# Patient Record
Sex: Male | Born: 1980 | Race: Black or African American | Hispanic: No | Marital: Single | Smoking: Never smoker
Health system: Southern US, Community
[De-identification: ages and names within clinical notes are randomized; demographics above are authoritative.]

## PROBLEM LIST (undated history)

## (undated) DIAGNOSIS — B20 Human immunodeficiency virus [HIV] disease: Secondary | ICD-10-CM

## (undated) DIAGNOSIS — Z21 Asymptomatic human immunodeficiency virus [HIV] infection status: Secondary | ICD-10-CM

## (undated) HISTORY — DX: Human immunodeficiency virus (HIV) disease: B20

## (undated) HISTORY — DX: Asymptomatic human immunodeficiency virus (hiv) infection status: Z21

---

## 2003-08-06 ENCOUNTER — Emergency Department (HOSPITAL_COMMUNITY): Admission: EM | Admit: 2003-08-06 | Discharge: 2003-08-06 | Payer: Self-pay | Admitting: Emergency Medicine

## 2005-02-09 ENCOUNTER — Emergency Department (HOSPITAL_COMMUNITY): Admission: EM | Admit: 2005-02-09 | Discharge: 2005-02-09 | Payer: Self-pay | Admitting: Emergency Medicine

## 2009-02-26 ENCOUNTER — Emergency Department (HOSPITAL_COMMUNITY): Admission: EM | Admit: 2009-02-26 | Discharge: 2009-02-26 | Payer: Self-pay | Admitting: Emergency Medicine

## 2009-12-02 ENCOUNTER — Emergency Department (HOSPITAL_COMMUNITY): Admission: EM | Admit: 2009-12-02 | Discharge: 2009-12-02 | Payer: Self-pay | Admitting: Emergency Medicine

## 2010-02-08 IMAGING — CR DG CHEST 2V
2 series · 2 of 2 positions shown · non-contrast
Comparison: None

CLINICAL DATA: Fever.

CHEST - 2 VIEW

[w chest pa]
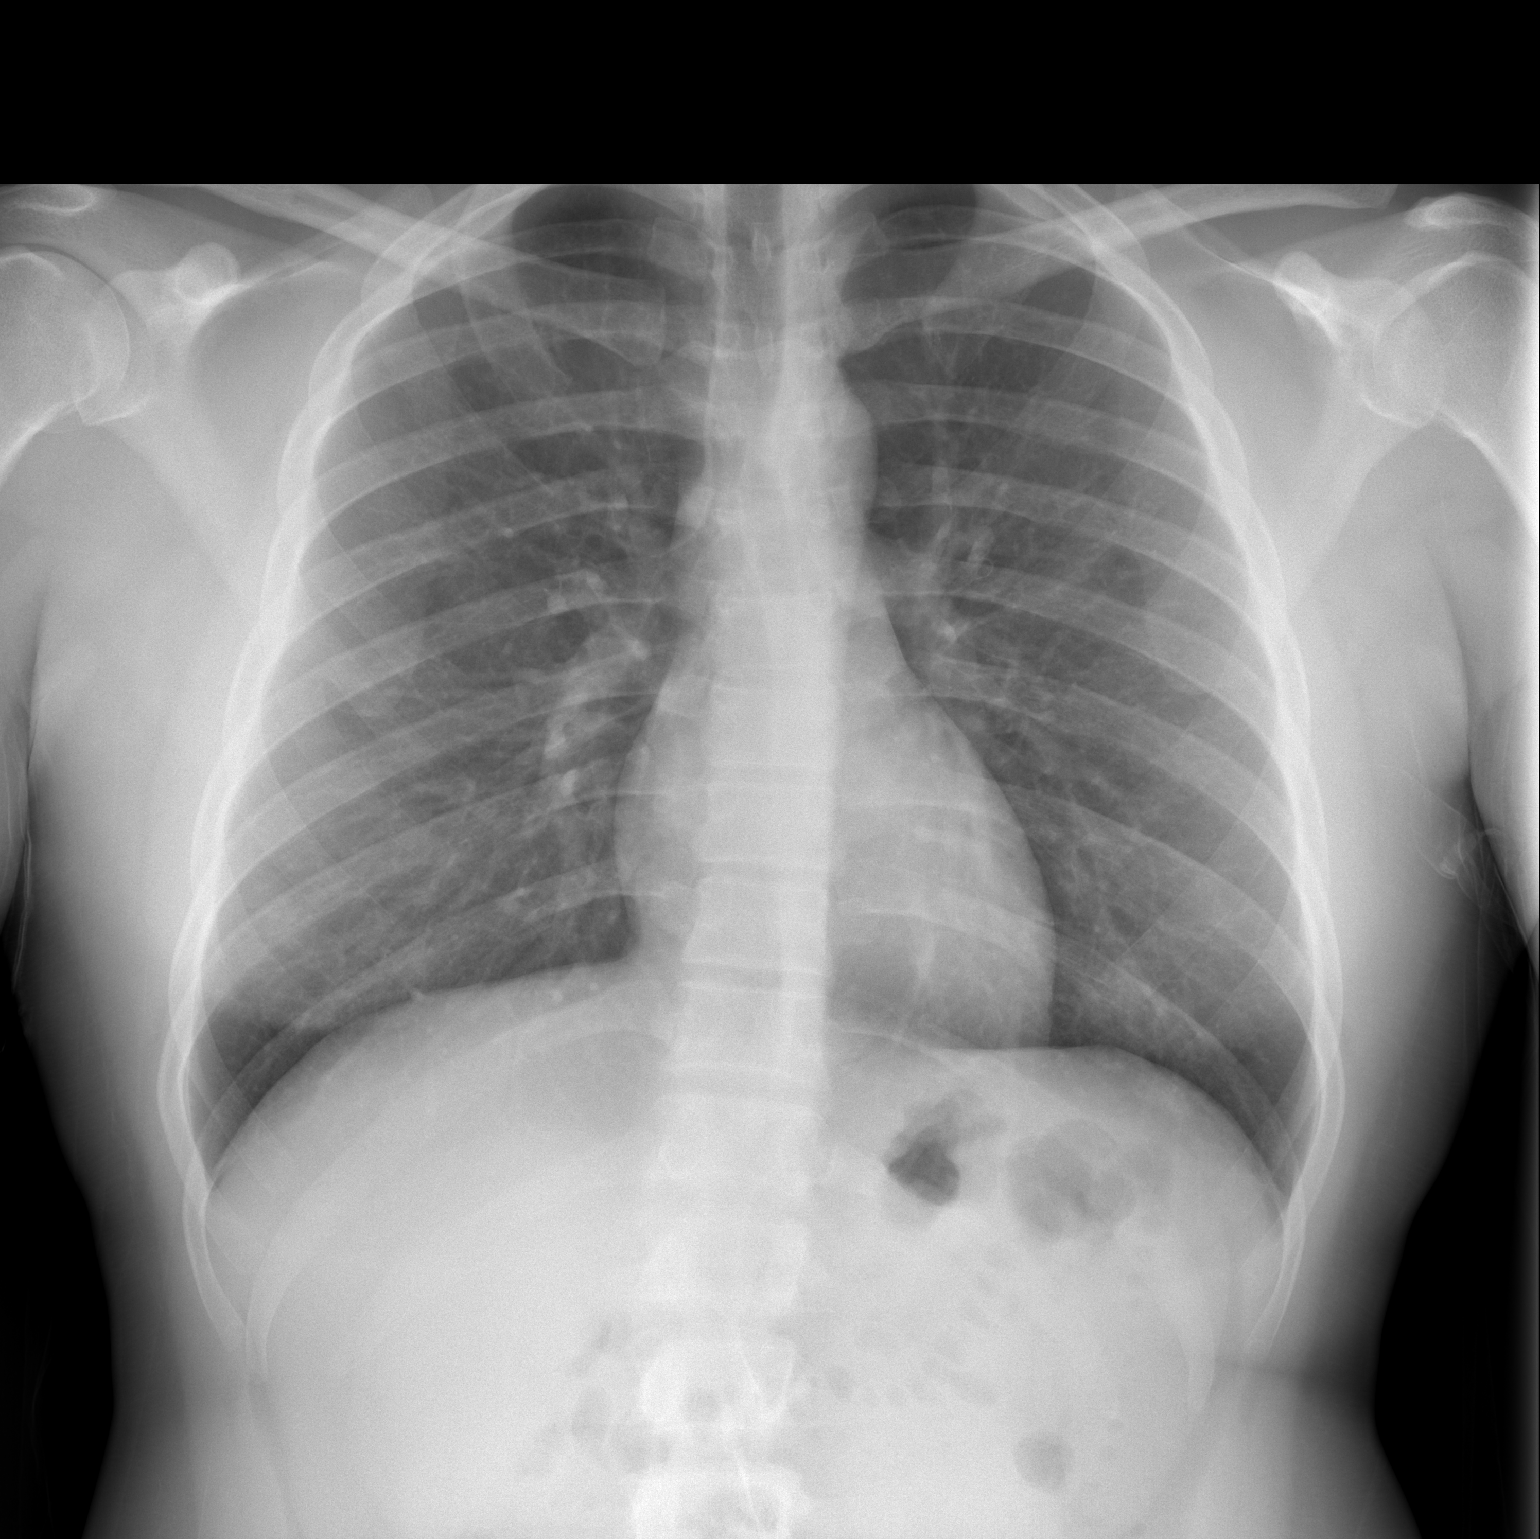

[w chest lat]
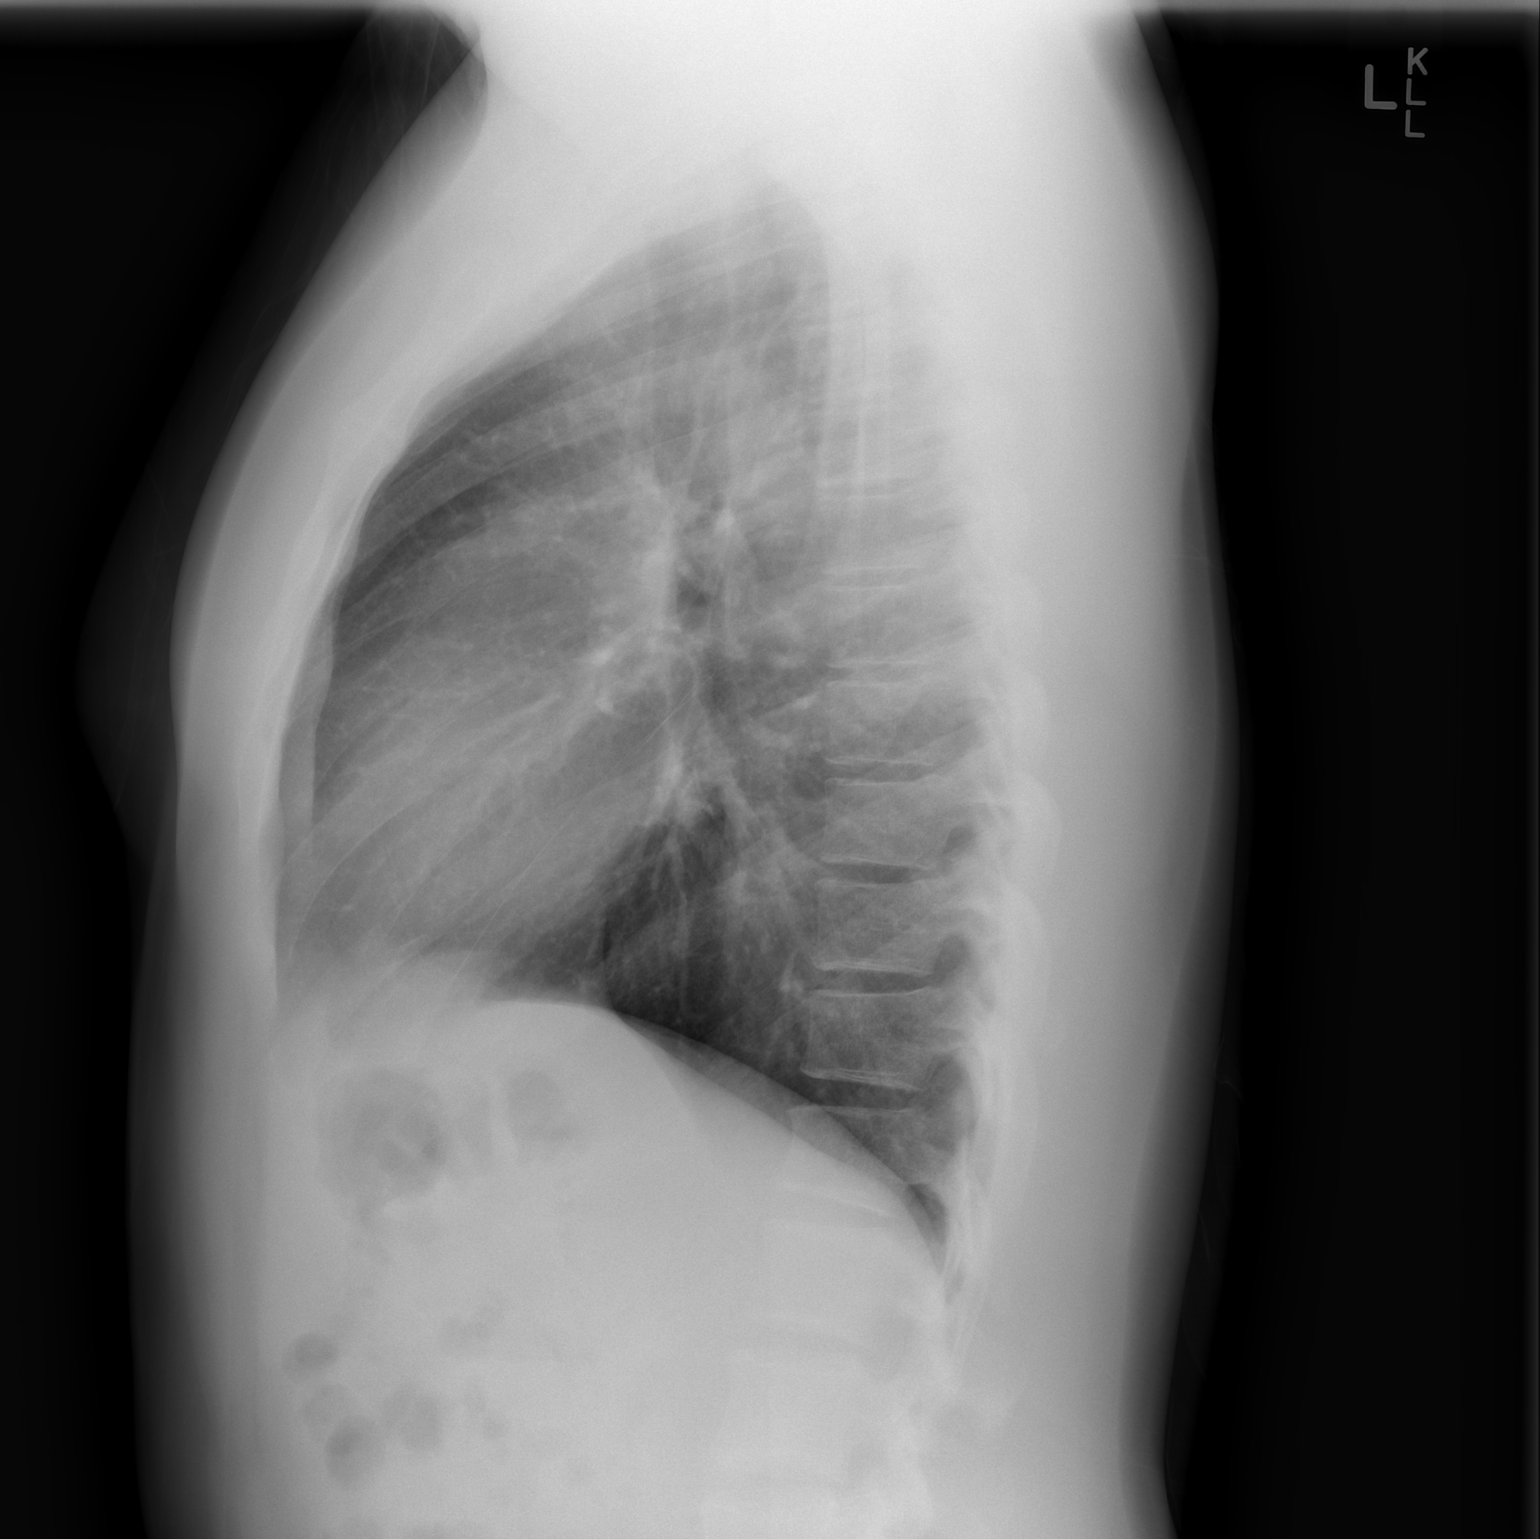

[2 of 2 positions shown; findings below may reference images not displayed]

FINDINGS: The cardiac silhouette, mediastinal and hilar contours
are within normal limits.  Mild bronchitic changes, likely related
to smoking.  No infiltrates, edema or effusions.  The bony thorax
intact.
IMPRESSION: Mild bronchitic changes, likely related to smoking.  No
infiltrates, edema or effusions.

## 2011-04-02 ENCOUNTER — Ambulatory Visit (INDEPENDENT_AMBULATORY_CARE_PROVIDER_SITE_OTHER): Payer: Self-pay

## 2011-04-02 DIAGNOSIS — Z113 Encounter for screening for infections with a predominantly sexual mode of transmission: Secondary | ICD-10-CM

## 2011-04-02 DIAGNOSIS — Z79899 Other long term (current) drug therapy: Secondary | ICD-10-CM

## 2011-04-02 DIAGNOSIS — B20 Human immunodeficiency virus [HIV] disease: Secondary | ICD-10-CM

## 2011-04-02 LAB — CBC WITH DIFFERENTIAL/PLATELET
Hemoglobin: 13.6 g/dL (ref 13.0–17.0)
Lymphocytes Relative: 36 % (ref 12–46)
Lymphs Abs: 1.7 10*3/uL (ref 0.7–4.0)
Monocytes Relative: 13 % — ABNORMAL HIGH (ref 3–12)
Neutro Abs: 2.1 10*3/uL (ref 1.7–7.7)
Neutrophils Relative %: 44 % (ref 43–77)
RBC: 4.75 MIL/uL (ref 4.22–5.81)
WBC: 4.8 10*3/uL (ref 4.0–10.5)

## 2011-04-02 LAB — LIPID PANEL
Cholesterol: 172 mg/dL (ref 0–200)
HDL: 34 mg/dL — ABNORMAL LOW (ref >39)
LDL Cholesterol: 113 mg/dL — ABNORMAL HIGH (ref 0–99)
Total CHOL/HDL Ratio: 5.1 Ratio
Triglycerides: 124 mg/dL (ref <150)
VLDL: 25 mg/dL (ref 0–40)

## 2011-04-03 LAB — COMPLETE METABOLIC PANEL WITH GFR
ALT: 27 U/L (ref 0–53)
Albumin: 3.9 g/dL (ref 3.5–5.2)
CO2: 27 mEq/L (ref 19–32)
GFR, Est African American: >60 mL/min (ref >60)
GFR, Est Non African American: >60 mL/min (ref >60)
Glucose, Bld: 86 mg/dL (ref 70–99)
Potassium: 3.9 mEq/L (ref 3.5–5.3)
Sodium: 140 mEq/L (ref 135–145)
Total Protein: 8.4 g/dL — ABNORMAL HIGH (ref 6.0–8.3)

## 2011-04-03 LAB — URINALYSIS, ROUTINE W REFLEX MICROSCOPIC
Bilirubin Urine: NEGATIVE
Glucose, UA: NEGATIVE mg/dL
Leukocytes, UA: NEGATIVE
Protein, ur: NEGATIVE mg/dL
pH: 6.5 (ref 5.0–8.0)

## 2011-04-03 LAB — HIV-1 RNA ULTRAQUANT REFLEX TO GENTYP+
HIV 1 RNA Quant: 46400 copies/mL — ABNORMAL HIGH (ref <20)
HIV-1 RNA Quant, Log: 4.67 {Log} — ABNORMAL HIGH (ref <1.30)

## 2011-04-03 LAB — HEPATITIS B SURFACE ANTIBODY,QUALITATIVE: Hep B S Ab: POSITIVE — AB

## 2011-04-03 LAB — T-HELPER CELL (CD4) - (RCID CLINIC ONLY)
CD4 % Helper T Cell: 17 % — ABNORMAL LOW (ref 33–55)
CD4 T Cell Abs: 280 uL — ABNORMAL LOW (ref 400–2700)

## 2011-04-03 LAB — RPR

## 2011-04-04 LAB — TB SKIN TEST: Induration: 0

## 2011-04-05 NOTE — Progress Notes (Signed)
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 He is a part of the [employer] and anticipates starting a new job soon. [place] [employer] and received several vaccines from the health dept.  He is a well mannered young man who works out 5 times weekly with weights and cardio. He has not presented for treatment since his diagnosis in 2010 "due to a serious of unfortunate events in his life/family". He is now ready to deal with his diagnosis and has been without any symptoms

## 2011-04-11 LAB — HIV-1 GENOTYPR PLUS

## 2011-04-12 ENCOUNTER — Telehealth: Payer: Self-pay | Admitting: *Deleted

## 2011-04-12 NOTE — Telephone Encounter (Signed)
PPD negative

## 2011-04-19 ENCOUNTER — Encounter: Payer: Self-pay | Admitting: Internal Medicine

## 2011-04-19 ENCOUNTER — Ambulatory Visit: Payer: Self-pay

## 2011-04-19 ENCOUNTER — Other Ambulatory Visit: Payer: Self-pay | Admitting: *Deleted

## 2011-04-19 ENCOUNTER — Ambulatory Visit (INDEPENDENT_AMBULATORY_CARE_PROVIDER_SITE_OTHER): Payer: Self-pay | Admitting: Internal Medicine

## 2011-04-19 VITALS — BP 119/74 | HR 88 | Temp 98.8°F | Ht 66.5 in | Wt 200.0 lb

## 2011-04-19 DIAGNOSIS — B20 Human immunodeficiency virus [HIV] disease: Secondary | ICD-10-CM

## 2011-04-19 DIAGNOSIS — Z23 Encounter for immunization: Secondary | ICD-10-CM

## 2011-04-19 MED ORDER — EFAVIRENZ-EMTRICITAB-TENOFOVIR 600-200-300 MG PO TABS: 1.0000 | ORAL_TABLET | Freq: Every day | ORAL | Status: DC

## 2011-06-07 ENCOUNTER — Other Ambulatory Visit: Payer: Self-pay | Admitting: Internal Medicine

## 2011-06-07 ENCOUNTER — Other Ambulatory Visit (INDEPENDENT_AMBULATORY_CARE_PROVIDER_SITE_OTHER): Payer: Self-pay

## 2011-06-07 DIAGNOSIS — B20 Human immunodeficiency virus [HIV] disease: Secondary | ICD-10-CM

## 2011-06-07 LAB — COMPLETE METABOLIC PANEL WITH GFR
Albumin: 3.9 g/dL (ref 3.5–5.2)
BUN: 18 mg/dL (ref 6–23)
CO2: 21 mEq/L (ref 19–32)
Calcium: 9.4 mg/dL (ref 8.4–10.5)
Chloride: 108 mEq/L (ref 96–112)
GFR, Est African American: >60 mL/min (ref >60)
GFR, Est Non African American: >60 mL/min (ref >60)
Glucose, Bld: 113 mg/dL — ABNORMAL HIGH (ref 70–99)
Potassium: 4.4 mEq/L (ref 3.5–5.3)
Sodium: 138 mEq/L (ref 135–145)
Total Protein: 7.6 g/dL (ref 6.0–8.3)

## 2011-06-07 LAB — CBC WITH DIFFERENTIAL/PLATELET
Basophils Absolute: 0 10*3/uL (ref 0.0–0.1)
Basophils Relative: 1 % (ref 0–1)
Lymphocytes Relative: 37 % (ref 12–46)
MCHC: 33 g/dL (ref 30.0–36.0)
Monocytes Absolute: 0.4 10*3/uL (ref 0.1–1.0)
Neutro Abs: 2.3 10*3/uL (ref 1.7–7.7)
Platelets: 304 10*3/uL (ref 150–400)
RDW: 14.4 % (ref 11.5–15.5)
WBC: 4.6 10*3/uL (ref 4.0–10.5)

## 2011-06-07 LAB — T-HELPER CELL (CD4) - (RCID CLINIC ONLY): CD4 % Helper T Cell: 19 % — ABNORMAL LOW (ref 33–55)

## 2011-06-20 ENCOUNTER — Ambulatory Visit (INDEPENDENT_AMBULATORY_CARE_PROVIDER_SITE_OTHER): Payer: Self-pay | Admitting: Internal Medicine

## 2011-06-20 ENCOUNTER — Encounter: Payer: Self-pay | Admitting: Internal Medicine

## 2011-06-20 VITALS — BP 121/82 | HR 96 | Temp 98.8°F | Ht 66.0 in | Wt 199.0 lb

## 2011-06-20 DIAGNOSIS — B20 Human immunodeficiency virus [HIV] disease: Secondary | ICD-10-CM | POA: Insufficient documentation

## 2011-06-20 NOTE — Assessment & Plan Note (Signed)
He is doing well on his current regimen of Atripla. I did discuss with him condom use both protect others as well as himself from getting other resistant viruses. I also discussed healthy eating and his cholesterol, kidney, and cardiovascular long-term effects of the medicine as well as HIV. Discussed his diet the best is to have a low-fat healthy diet and to keep his weight down 4 optimal long-term benefit. He will return in 4 months and I did tell him to call sooner if he has any significant issues.

## 2011-06-20 NOTE — Progress Notes (Signed)
  Subjective:    Patient ID: Jesse Frank, male    DOB: 1980-09-03, 30 y.o.   MRN: [id number]  HPI Jesse Frank comes back in for followup after starting his regimen of Atripla. He states that he is tolerating it very well with no rashes and very minimal CNS side effects. He also reports nearly 100% compliance with only one dose that he took late. He otherwise continues to exercise well, he is improving his diet and is very happy with his current regimen. He denies any dysphasia, diarrhea, weight loss, abdominal pain, rash or urinary problems.    Review of Systems  Constitutional: Negative for fever, activity change, appetite change, fatigue and unexpected weight change.  Eyes: Negative for redness.  Respiratory: Negative for cough and shortness of breath.   Cardiovascular: Negative for leg swelling.  Gastrointestinal: Negative for nausea, abdominal pain, diarrhea, constipation and abdominal distention.  Musculoskeletal: Negative for myalgias and arthralgias.  Skin: Negative for rash.  Neurological: Negative for dizziness and headaches.  Hematological: Negative for adenopathy.  Psychiatric/Behavioral: Negative for confusion and dysphoric mood.       Objective:   Physical Exam  Constitutional: He is oriented to person, place, and time. He appears well-developed and well-nourished. No distress.  HENT:  Head: Normocephalic.  Mouth/Throat: Oropharynx is clear and moist. No oropharyngeal exudate.  Eyes: Right eye exhibits no discharge. Left eye exhibits no discharge. No scleral icterus.  Cardiovascular: Normal rate, regular rhythm and normal heart sounds.  Exam reveals no gallop.   No murmur heard. Pulmonary/Chest: Effort normal and breath sounds normal. No respiratory distress. He has no wheezes.  Abdominal: Soft. Bowel sounds are normal. He exhibits no distension and no mass. There is no tenderness.  Genitourinary: Penis normal. No penile tenderness.  Musculoskeletal: Normal range of  motion. He exhibits no edema.  Lymphadenopathy:    He has no cervical adenopathy.  Neurological: He is alert and oriented to person, place, and time. No cranial nerve deficit.  Skin: Skin is warm and dry. No rash noted. No erythema.  Psychiatric: He has a normal mood and affect. His behavior is normal.          Assessment & Plan:

## 2011-10-08 ENCOUNTER — Other Ambulatory Visit: Payer: Self-pay

## 2011-10-08 ENCOUNTER — Other Ambulatory Visit: Payer: Self-pay | Admitting: Licensed Clinical Social Worker

## 2011-10-08 DIAGNOSIS — B20 Human immunodeficiency virus [HIV] disease: Secondary | ICD-10-CM

## 2011-10-08 LAB — CBC WITH DIFFERENTIAL/PLATELET
Basophils Absolute: 0 10*3/uL (ref 0.0–0.1)
Lymphocytes Relative: 23 % (ref 12–46)
Lymphs Abs: 1.6 10*3/uL (ref 0.7–4.0)
Neutro Abs: 4.6 10*3/uL (ref 1.7–7.7)
Platelets: 245 10*3/uL (ref 150–400)
RBC: 5 MIL/uL (ref 4.22–5.81)
RDW: 13.4 % (ref 11.5–15.5)
WBC: 7 10*3/uL (ref 4.0–10.5)

## 2011-10-08 LAB — COMPLETE METABOLIC PANEL WITH GFR
AST: 17 U/L (ref 0–37)
Albumin: 4 g/dL (ref 3.5–5.2)
Alkaline Phosphatase: 57 U/L (ref 39–117)
BUN: 8 mg/dL (ref 6–23)
Calcium: 9.7 mg/dL (ref 8.4–10.5)
Chloride: 101 mEq/L (ref 96–112)
Creat: 1.2 mg/dL (ref 0.50–1.35)
GFR, Est Non African American: 81 mL/min
Glucose, Bld: 105 mg/dL — ABNORMAL HIGH (ref 70–99)
Potassium: 4.1 mEq/L (ref 3.5–5.3)

## 2011-10-08 MED ORDER — EFAVIRENZ-EMTRICITAB-TENOFOVIR 600-200-300 MG PO TABS: 1.0000 | ORAL_TABLET | Freq: Every day | ORAL | Status: DC

## 2011-10-09 LAB — T-HELPER CELL (CD4) - (RCID CLINIC ONLY)
CD4 % Helper T Cell: 26 % — ABNORMAL LOW (ref 33–55)
CD4 T Cell Abs: 420 uL (ref 400–2700)

## 2011-10-10 LAB — HIV-1 RNA QUANT-NO REFLEX-BLD: HIV 1 RNA Quant: <20 copies/mL (ref <20)

## 2011-10-21 ENCOUNTER — Other Ambulatory Visit: Payer: Self-pay | Admitting: *Deleted

## 2011-10-21 DIAGNOSIS — B20 Human immunodeficiency virus [HIV] disease: Secondary | ICD-10-CM

## 2011-10-21 MED ORDER — EFAVIRENZ-EMTRICITAB-TENOFOVIR 600-200-300 MG PO TABS: 1.0000 | ORAL_TABLET | Freq: Every day | ORAL | Status: DC

## 2011-10-22 ENCOUNTER — Ambulatory Visit (INDEPENDENT_AMBULATORY_CARE_PROVIDER_SITE_OTHER): Payer: Self-pay | Admitting: Internal Medicine

## 2011-10-22 ENCOUNTER — Encounter: Payer: Self-pay | Admitting: Internal Medicine

## 2011-10-22 VITALS — BP 115/78 | HR 111 | Temp 98.5°F | Wt 210.0 lb

## 2011-10-22 DIAGNOSIS — B20 Human immunodeficiency virus [HIV] disease: Secondary | ICD-10-CM

## 2011-10-22 NOTE — Assessment & Plan Note (Signed)
He is now undetectable and was very pleased with that.  He will return in 3 months and if it remains undetectable, can spread out his appointments.  I did remind him to use condoms with all sexual activity.

## 2011-10-22 NOTE — Progress Notes (Signed)
  Subjective:    Patient ID: Jesse Frank, male    DOB: 02-27-1981, 31 y.o.   MRN: [id number]  HPI he is here for routine follow up.  He was started on Atripla in 2012 and has been tolerating it well.  No new issues.  He endorses just a missed dose or two.  He has no complaints and no recent STIs or other issues.      Review of Systems  Constitutional: Negative.   HENT: Negative for sore throat and trouble swallowing.   Respiratory: Negative for cough, shortness of breath and wheezing.   Cardiovascular: Negative for chest pain, palpitations and leg swelling.  Gastrointestinal: Negative for nausea, vomiting, abdominal pain and diarrhea.  Genitourinary: Negative for discharge and genital sores.  Musculoskeletal: Negative for myalgias and arthralgias.  Skin: Negative for pallor and rash.  Neurological: Negative for syncope, weakness and headaches.  Hematological: Negative for adenopathy.  Psychiatric/Behavioral: Negative for sleep disturbance and dysphoric mood. The patient is not nervous/anxious.        Objective:   Physical Exam  Constitutional: He appears well-developed and well-nourished. No distress.  HENT:  Mouth/Throat: Oropharynx is clear and moist. No oropharyngeal exudate.  Cardiovascular: Normal rate, regular rhythm and normal heart sounds.  Exam reveals no gallop and no friction rub.   No murmur heard. Pulmonary/Chest: Effort normal and breath sounds normal. No respiratory distress. He has no wheezes. He has no rales.  Abdominal: Soft. Bowel sounds are normal. He exhibits no distension. There is no tenderness. There is no rebound.  Lymphadenopathy:    He has no cervical adenopathy.  Skin: Skin is warm and dry. No rash noted. No erythema.  Psychiatric: He has a normal mood and affect. His behavior is normal.          Assessment & Plan:

## 2012-01-07 ENCOUNTER — Telehealth: Payer: Self-pay

## 2012-01-07 NOTE — Telephone Encounter (Signed)
Patient had stated recently he had insurance - called and left message for him to call me about Jesse Frank if that's changed, or to make sure he gets his insurance info in the system when he comes in for appt. this week.

## 2012-01-09 ENCOUNTER — Other Ambulatory Visit: Payer: Self-pay

## 2012-01-09 DIAGNOSIS — B20 Human immunodeficiency virus [HIV] disease: Secondary | ICD-10-CM

## 2012-01-09 LAB — COMPREHENSIVE METABOLIC PANEL
ALT: 51 U/L (ref 0–53)
Albumin: 4 g/dL (ref 3.5–5.2)
Alkaline Phosphatase: 60 U/L (ref 39–117)
Glucose, Bld: 102 mg/dL — ABNORMAL HIGH (ref 70–99)
Potassium: 4.4 mEq/L (ref 3.5–5.3)
Sodium: 138 mEq/L (ref 135–145)
Total Bilirubin: 0.3 mg/dL (ref 0.3–1.2)
Total Protein: 7.3 g/dL (ref 6.0–8.3)

## 2012-01-09 LAB — CBC WITH DIFFERENTIAL/PLATELET
Basophils Relative: 1 % (ref 0–1)
Eosinophils Absolute: 0.1 10*3/uL (ref 0.0–0.7)
Hemoglobin: 15.2 g/dL (ref 13.0–17.0)
Lymphs Abs: 1.6 10*3/uL (ref 0.7–4.0)
MCH: 30.3 pg (ref 26.0–34.0)
MCHC: 34.1 g/dL (ref 30.0–36.0)
Monocytes Relative: 8 % (ref 3–12)
Neutro Abs: 2.2 10*3/uL (ref 1.7–7.7)
Neutrophils Relative %: 52 % (ref 43–77)
Platelets: 255 10*3/uL (ref 150–400)
RBC: 5.01 MIL/uL (ref 4.22–5.81)

## 2012-01-10 LAB — HIV-1 RNA QUANT-NO REFLEX-BLD
HIV 1 RNA Quant: <20 copies/mL (ref <20)
HIV-1 RNA Quant, Log: <1.3 {Log} (ref <1.30)

## 2012-01-23 ENCOUNTER — Ambulatory Visit (INDEPENDENT_AMBULATORY_CARE_PROVIDER_SITE_OTHER): Payer: Private Health Insurance - Indemnity | Admitting: Internal Medicine

## 2012-01-23 ENCOUNTER — Encounter: Payer: Self-pay | Admitting: Internal Medicine

## 2012-01-23 ENCOUNTER — Other Ambulatory Visit (INDEPENDENT_AMBULATORY_CARE_PROVIDER_SITE_OTHER): Payer: Private Health Insurance - Indemnity | Admitting: *Deleted

## 2012-01-23 VITALS — BP 118/77 | HR 78 | Temp 98.5°F | Ht 66.0 in | Wt 217.8 lb

## 2012-01-23 DIAGNOSIS — Z113 Encounter for screening for infections with a predominantly sexual mode of transmission: Secondary | ICD-10-CM

## 2012-01-23 DIAGNOSIS — B20 Human immunodeficiency virus [HIV] disease: Secondary | ICD-10-CM

## 2012-01-23 DIAGNOSIS — Z0000 Encounter for general adult medical examination without abnormal findings: Secondary | ICD-10-CM

## 2012-01-23 DIAGNOSIS — Z79899 Other long term (current) drug therapy: Secondary | ICD-10-CM

## 2012-01-23 DIAGNOSIS — Z23 Encounter for immunization: Secondary | ICD-10-CM

## 2012-01-23 MED ORDER — EFAVIRENZ-EMTRICITAB-TENOFOVIR 600-200-300 MG PO TABS: 1.0000 | ORAL_TABLET | Freq: Every day | ORAL | Status: DC

## 2012-01-23 NOTE — Progress Notes (Signed)
  Subjective:    Patient ID: Jesse Frank, male    DOB: 1981-07-08, 31 y.o.   MRN: [id number]  HPI He comes in for routine followup. Previously started on Atripla and continues to take it very well. He reports continued 100% compliance. No issues with side effects. No weight loss, no diarrhea no other issues. Feels well with his regimen and is very pleased with the lack of side effects. He is exercising some but does admit he needs to increase that. He does not smoke and he otherwise has no new issues including no recent hospitalizations.   Review of Systems  Constitutional: Negative.   HENT: Negative for sore throat and trouble swallowing.   Respiratory: Negative for cough, shortness of breath and wheezing.   Cardiovascular: Negative for chest pain, palpitations and leg swelling.  Gastrointestinal: Negative for nausea, vomiting, diarrhea, constipation and abdominal distention.  Musculoskeletal: Negative for myalgias, joint swelling and arthralgias.  Skin: Negative for pallor and rash.  Neurological: Negative for dizziness, weakness and headaches.  Hematological: Negative for adenopathy.  Psychiatric/Behavioral: Negative for dysphoric mood. The patient is not nervous/anxious.        Objective:   Physical Exam  Constitutional: He appears well-developed and well-nourished. No distress.  HENT:  Mouth/Throat: Oropharynx is clear and moist. No oropharyngeal exudate.  Cardiovascular: Normal rate, regular rhythm and normal heart sounds.  Exam reveals no gallop and no friction rub.   No murmur heard. Pulmonary/Chest: Effort normal and breath sounds normal. No respiratory distress. He has no wheezes. He has no rales.  Abdominal: Soft. Bowel sounds are normal. He exhibits no distension. There is no tenderness. There is no rebound.  Lymphadenopathy:    He has no cervical adenopathy.  Skin: Skin is warm and dry. No rash noted. No erythema.  Psychiatric: He has a normal mood and affect. His  behavior is normal.          Assessment & Plan:

## 2012-01-23 NOTE — Assessment & Plan Note (Addendum)
He is doing very well his regimen and now has remained undetectable for 2 visits. He will continue with his medications and will return in about 4-5 months for followup and a flu shot. He otherwise is up-to-date with his vaccines. He knows to call if he has any issues in the meantime. I did discuss increasing his weight loss and increasing his exercise to improve his long-term prognosis. Patient will attempt to do more exercise.

## 2012-02-17 ENCOUNTER — Other Ambulatory Visit: Payer: Self-pay | Admitting: *Deleted

## 2012-02-17 DIAGNOSIS — B20 Human immunodeficiency virus [HIV] disease: Secondary | ICD-10-CM

## 2012-02-17 MED ORDER — EFAVIRENZ-EMTRICITAB-TENOFOVIR 600-200-300 MG PO TABS: 1.0000 | ORAL_TABLET | Freq: Every day | ORAL | Status: DC

## 2012-06-17 ENCOUNTER — Telehealth: Payer: Self-pay | Admitting: *Deleted

## 2012-06-17 NOTE — Telephone Encounter (Signed)
Patient called and advised he thinks that he may need to be switched to a medication other than Atripla. He advised that he is having trouble concentrating at work. He advised he works on [employer] and has been having trouble focusing for long periods of time. He also is having trouble sleeping. He wants Dr Luciana Axe to write a letter to his employer in the mean time to help explain that this is a possible side effect until he has his appt 07/10/12. I tried to get the patient

## 2012-06-24 NOTE — Telephone Encounter (Signed)
Ok to write the letter.  See if he can come in tomorrow, ok to overbook and I will look at changing him.

## 2012-06-24 NOTE — Telephone Encounter (Signed)
Patient is set to come in tomorrow 06/25/12 at 1115 slot.

## 2012-06-25 ENCOUNTER — Other Ambulatory Visit: Payer: Private Health Insurance - Indemnity

## 2012-06-25 ENCOUNTER — Encounter: Payer: Self-pay | Admitting: Internal Medicine

## 2012-06-25 ENCOUNTER — Ambulatory Visit (INDEPENDENT_AMBULATORY_CARE_PROVIDER_SITE_OTHER): Payer: Private Health Insurance - Indemnity | Admitting: Internal Medicine

## 2012-06-25 VITALS — BP 122/79 | HR 80 | Temp 98.7°F | Ht 66.0 in | Wt 218.0 lb

## 2012-06-25 DIAGNOSIS — B20 Human immunodeficiency virus [HIV] disease: Secondary | ICD-10-CM

## 2012-06-25 DIAGNOSIS — Z23 Encounter for immunization: Secondary | ICD-10-CM

## 2012-06-25 MED ORDER — ELVITEG-COBIC-EMTRICIT-TENOFDF 150-150-200-300 MG PO TABS: 1.0000 | ORAL_TABLET | Freq: Every day | ORAL | Status: DC

## 2012-06-25 NOTE — Assessment & Plan Note (Signed)
With the concern of his symptoms, I will change him to Stribild today. I also told told him that many of his symptoms may be more related to anxiety and he will seek mental health counseling. He will return in one month for labs and I will see him after that for followup on the new medication.

## 2012-06-25 NOTE — Progress Notes (Signed)
  Subjective:    Patient ID: Jesse Frank, male    DOB: October 24, 1980, 31 y.o.   MRN: [id number]  HPI He comes in for a work in visit with complaint of difficulty concentrating at work as well as increased stress. He tells me this has been going on for about 2 months.  He has paperwork that will help give him leave from his work while he works on the stress. He also is concerned with the [employer].   Review of Systems  Constitutional: Positive for activity change and unexpected weight change. Negative for fever, appetite change and fatigue.       He has gained some weight from not exercising and not feeling well  Respiratory: Negative for cough and shortness of breath.   Gastrointestinal: Negative for nausea, abdominal pain and diarrhea.  Skin: Negative for rash.  Neurological: Positive for dizziness and light-headedness. Negative for headaches.  Psychiatric/Behavioral: Positive for disturbed wake/sleep cycle.       Objective:   Physical Exam  Constitutional: He appears well-developed and well-nourished. No distress.  Cardiovascular: Normal rate, regular rhythm and normal heart sounds.  Exam reveals no gallop and no friction rub.   No murmur heard. Pulmonary/Chest: Effort normal and breath sounds normal. No respiratory distress. He has no wheezes. He has no rales.  Abdominal: Soft. Bowel sounds are normal. He exhibits no distension. There is no tenderness. There is no rebound.          Assessment & Plan:

## 2012-07-01 ENCOUNTER — Telehealth: Payer: Self-pay

## 2012-07-01 NOTE — Telephone Encounter (Signed)
Case Manager is calling re: form faxed for disability. Pt requested a behavioral health disability claim.  She realizes Dr Luciana Axe is an Infectious Disease physician and says if he is not able to complete the form based on the mental health of the patient a different form (medical) can be sent for completion.   Form must be completed prior to July 09, 2012 whether it is medical or mental health.  She can be reached for more information at [phone]. Form was located in Dr [employer] box in the triage area.   Laurell Josephs, RN

## 2012-07-06 ENCOUNTER — Telehealth: Payer: Self-pay | Admitting: *Deleted

## 2012-07-06 NOTE — Telephone Encounter (Signed)
Patient's insurance is denying his claim under Dr. Ephriam Knuckles recommendation to be out of work x 1 month due to medication side effects.  They are stating that he needs to be under a psychiatrist's care.  Left message for him to return my call at which time he can be given the number for [facility] if he wants to arrange to see someone there or to be seen by a private provider. Wendall Mola CMA

## 2012-07-07 ENCOUNTER — Telehealth: Payer: Self-pay | Admitting: *Deleted

## 2012-07-07 NOTE — Telephone Encounter (Signed)
Patient returned the call he received from [place]. Advised him of the note and patient requested [facility] [employer] number and gave him [phone] advised him if they need anything have them call the office and we will do what we can. Gertie Gowda he returned the call.

## 2012-07-09 ENCOUNTER — Ambulatory Visit: Payer: Private Health Insurance - Indemnity | Admitting: Internal Medicine

## 2012-07-09 ENCOUNTER — Other Ambulatory Visit: Payer: Private Health Insurance - Indemnity

## 2012-07-09 DIAGNOSIS — B20 Human immunodeficiency virus [HIV] disease: Secondary | ICD-10-CM

## 2012-07-09 DIAGNOSIS — Z113 Encounter for screening for infections with a predominantly sexual mode of transmission: Secondary | ICD-10-CM

## 2012-07-09 LAB — COMPREHENSIVE METABOLIC PANEL
ALT: 53 U/L (ref 0–53)
AST: 21 U/L (ref 0–37)
Albumin: 4.3 g/dL (ref 3.5–5.2)
Alkaline Phosphatase: 67 U/L (ref 39–117)
BUN: 10 mg/dL (ref 6–23)
Creat: 1.02 mg/dL (ref 0.50–1.35)
Potassium: 4.5 mEq/L (ref 3.5–5.3)

## 2012-07-09 LAB — CBC WITH DIFFERENTIAL/PLATELET
Basophils Absolute: 0.1 10*3/uL (ref 0.0–0.1)
Basophils Relative: 1 % (ref 0–1)
HCT: 46 % (ref 39.0–52.0)
MCHC: 35.2 g/dL (ref 30.0–36.0)
Monocytes Absolute: 0.5 10*3/uL (ref 0.1–1.0)
Neutro Abs: 3.7 10*3/uL (ref 1.7–7.7)
Platelets: 279 10*3/uL (ref 150–400)
RDW: 14.4 % (ref 11.5–15.5)
WBC: 6.4 10*3/uL (ref 4.0–10.5)

## 2012-07-10 LAB — HIV-1 RNA QUANT-NO REFLEX-BLD
HIV 1 RNA Quant: <20 copies/mL (ref <20)
HIV-1 RNA Quant, Log: <1.3 {Log} (ref <1.30)

## 2012-07-14 ENCOUNTER — Telehealth: Payer: Self-pay | Admitting: *Deleted

## 2012-07-14 NOTE — Telephone Encounter (Signed)
Called patient and notified he was positive for chlamydia, and per Dr. Luciana Axe he needs to be treated with Rocephin 250 mg IM and Azithromycin 1,000 mg by mouth.  He was put on the nurse schedule for 07/17/12. Wendall Mola CMA

## 2012-07-16 ENCOUNTER — Ambulatory Visit (INDEPENDENT_AMBULATORY_CARE_PROVIDER_SITE_OTHER): Payer: Private Health Insurance - Indemnity | Admitting: *Deleted

## 2012-07-16 DIAGNOSIS — A749 Chlamydial infection, unspecified: Secondary | ICD-10-CM

## 2012-07-16 MED ORDER — LIDOCAINE HCL 1 % IJ SOLN
250.0000 mg | Freq: Once | INTRAMUSCULAR | Status: AC
  Administered 2012-07-16: 250 mg via INTRAMUSCULAR

## 2012-07-16 MED ORDER — AZITHROMYCIN 250 MG PO TABS
1000.0000 mg | ORAL_TABLET | Freq: Once | ORAL | Status: AC
  Administered 2012-07-16: 1000 mg via ORAL

## 2012-08-06 ENCOUNTER — Telehealth: Payer: Self-pay | Admitting: *Deleted

## 2012-08-06 ENCOUNTER — Ambulatory Visit: Payer: Private Health Insurance - Indemnity | Admitting: Internal Medicine

## 2012-08-06 NOTE — Telephone Encounter (Signed)
Called and left patient a voice mail to call the clinic to reschedule his appt, he no showed today. Jacqueline Cockerham  

## 2012-08-06 NOTE — Telephone Encounter (Signed)
Per Dr. Luciana Axe patient's lab look good and he can call for the results and just get a follow appt for labs and MD in 3 months. Called and left voice mail stating this. Wendall Mola

## 2012-12-17 ENCOUNTER — Other Ambulatory Visit: Payer: Self-pay | Admitting: Internal Medicine

## 2013-01-07 ENCOUNTER — Other Ambulatory Visit: Payer: Self-pay | Admitting: Internal Medicine

## 2013-01-07 ENCOUNTER — Other Ambulatory Visit: Payer: Private Health Insurance - Indemnity

## 2013-01-07 DIAGNOSIS — B20 Human immunodeficiency virus [HIV] disease: Secondary | ICD-10-CM

## 2013-01-07 DIAGNOSIS — Z113 Encounter for screening for infections with a predominantly sexual mode of transmission: Secondary | ICD-10-CM

## 2013-01-07 DIAGNOSIS — Z79899 Other long term (current) drug therapy: Secondary | ICD-10-CM

## 2013-01-07 LAB — CBC WITH DIFFERENTIAL/PLATELET
Basophils Relative: 1 % (ref 0–1)
Eosinophils Relative: 2 % (ref 0–5)
HCT: 44.1 % (ref 39.0–52.0)
Hemoglobin: 15.2 g/dL (ref 13.0–17.0)
MCH: 29.7 pg (ref 26.0–34.0)
MCHC: 34.5 g/dL (ref 30.0–36.0)
MCV: 86.1 fL (ref 78.0–100.0)
Monocytes Absolute: 0.5 10*3/uL (ref 0.1–1.0)
Monocytes Relative: 8 % (ref 3–12)
Neutro Abs: 3.2 10*3/uL (ref 1.7–7.7)
RDW: 14.4 % (ref 11.5–15.5)

## 2013-01-08 LAB — COMPREHENSIVE METABOLIC PANEL
Albumin: 4.2 g/dL (ref 3.5–5.2)
Alkaline Phosphatase: 50 U/L (ref 39–117)
BUN: 9 mg/dL (ref 6–23)
Calcium: 9.8 mg/dL (ref 8.4–10.5)
Creat: 1.22 mg/dL (ref 0.50–1.35)
Glucose, Bld: 98 mg/dL (ref 70–99)
Potassium: 4.4 mEq/L (ref 3.5–5.3)

## 2013-01-08 LAB — LIPID PANEL
Cholesterol: 189 mg/dL (ref 0–200)
Total CHOL/HDL Ratio: 5.7 Ratio
VLDL: 28 mg/dL (ref 0–40)

## 2013-01-08 LAB — T-HELPER CELL (CD4) - (RCID CLINIC ONLY)
CD4 % Helper T Cell: 27 % — ABNORMAL LOW (ref 33–55)
CD4 T Cell Abs: 550 uL (ref 400–2700)

## 2013-01-08 LAB — RPR

## 2013-01-10 LAB — HIV-1 RNA QUANT-NO REFLEX-BLD: HIV 1 RNA Quant: <20 copies/mL (ref <20)

## 2013-01-20 ENCOUNTER — Other Ambulatory Visit: Payer: Self-pay | Admitting: Internal Medicine

## 2013-01-21 ENCOUNTER — Ambulatory Visit (INDEPENDENT_AMBULATORY_CARE_PROVIDER_SITE_OTHER): Payer: Private Health Insurance - Indemnity | Admitting: Internal Medicine

## 2013-01-21 ENCOUNTER — Encounter: Payer: Self-pay | Admitting: Internal Medicine

## 2013-01-21 VITALS — BP 132/80 | HR 98 | Temp 98.5°F | Ht 67.0 in | Wt 232.0 lb

## 2013-01-21 DIAGNOSIS — B20 Human immunodeficiency virus [HIV] disease: Secondary | ICD-10-CM

## 2013-01-21 DIAGNOSIS — Z79899 Other long term (current) drug therapy: Secondary | ICD-10-CM

## 2013-01-21 DIAGNOSIS — Z23 Encounter for immunization: Secondary | ICD-10-CM

## 2013-01-21 DIAGNOSIS — E785 Hyperlipidemia, unspecified: Secondary | ICD-10-CM

## 2013-01-21 DIAGNOSIS — Z113 Encounter for screening for infections with a predominantly sexual mode of transmission: Secondary | ICD-10-CM

## 2013-01-21 NOTE — Progress Notes (Signed)
  Subjective:    Patient ID: Jesse Frank, male    DOB: 1980-12-16, 32 y.o.   MRN: [id number]  HPI He comes in for followup of HIV. He previously was on Atripla however due to some symptoms of anxiety and decreased concentration at work I changed him to [employer]. He started that 6 months ago after her last saw him and he has noted a significant improvement in all of his symptoms. Today he is much happier and pleased with those results. He is happy with his current medication and will continue with this. He denies any missed doses and his CD4 count and viral load are good with a CD4 of 550 and an undetectable viral load. His cholesterol was checked however he was not fasting for that lab.   Review of Systems  Constitutional: Negative for fever, appetite change, fatigue and unexpected weight change.  HENT: Negative for sore throat and trouble swallowing.   Respiratory: Negative for cough and shortness of breath.   Cardiovascular: Negative for chest pain and leg swelling.  Gastrointestinal: Negative for nausea, abdominal pain and diarrhea.  Musculoskeletal: Negative for myalgias, joint swelling and arthralgias.  Skin: Negative for rash.  Neurological: Negative for dizziness, light-headedness and headaches.       Objective:   Physical Exam  Constitutional: He appears well-developed and well-nourished. No distress.  HENT:  Mouth/Throat: Oropharynx is clear and moist. No oropharyngeal exudate.  Cardiovascular: Normal rate, regular rhythm and normal heart sounds.  Exam reveals no gallop and no friction rub.   No murmur heard. Pulmonary/Chest: Effort normal and breath sounds normal. No respiratory distress. He has no wheezes. He has no rales.  Lymphadenopathy:    He has no cervical adenopathy.  Skin: No rash noted.          Assessment & Plan:

## 2013-01-21 NOTE — Patient Instructions (Signed)
And is in the past of some

## 2013-01-21 NOTE — Assessment & Plan Note (Signed)
His cholesterol is mildly elevated I will check it next visit fasting.

## 2013-01-21 NOTE — Assessment & Plan Note (Signed)
Doing well on his new regimen. He will return in 6 months

## 2013-03-06 ENCOUNTER — Other Ambulatory Visit: Payer: Self-pay | Admitting: Internal Medicine

## 2013-04-05 ENCOUNTER — Other Ambulatory Visit: Payer: Self-pay | Admitting: Internal Medicine

## 2013-04-05 DIAGNOSIS — B20 Human immunodeficiency virus [HIV] disease: Secondary | ICD-10-CM

## 2013-07-08 ENCOUNTER — Other Ambulatory Visit: Payer: Self-pay

## 2013-07-13 ENCOUNTER — Other Ambulatory Visit: Payer: Private Health Insurance - Indemnity

## 2013-07-13 DIAGNOSIS — Z79899 Other long term (current) drug therapy: Secondary | ICD-10-CM

## 2013-07-13 DIAGNOSIS — B20 Human immunodeficiency virus [HIV] disease: Secondary | ICD-10-CM

## 2013-07-13 DIAGNOSIS — Z113 Encounter for screening for infections with a predominantly sexual mode of transmission: Secondary | ICD-10-CM

## 2013-07-13 LAB — CBC WITH DIFFERENTIAL/PLATELET
Basophils Absolute: 0 10*3/uL (ref 0.0–0.1)
Eosinophils Absolute: 0.1 10*3/uL (ref 0.0–0.7)
Eosinophils Relative: 2 % (ref 0–5)
MCH: 29.9 pg (ref 26.0–34.0)
MCV: 87.1 fL (ref 78.0–100.0)
Platelets: 245 10*3/uL (ref 150–400)
RDW: 13.8 % (ref 11.5–15.5)
WBC: 5.6 10*3/uL (ref 4.0–10.5)

## 2013-07-13 LAB — RPR

## 2013-07-13 LAB — LIPID PANEL
Cholesterol: 209 mg/dL — ABNORMAL HIGH (ref 0–200)
HDL: 38 mg/dL — ABNORMAL LOW (ref >39)
Total CHOL/HDL Ratio: 5.5 Ratio
VLDL: 24 mg/dL (ref 0–40)

## 2013-07-13 LAB — COMPLETE METABOLIC PANEL WITH GFR
ALT: 38 U/L (ref 0–53)
AST: 22 U/L (ref 0–37)
Albumin: 3.9 g/dL (ref 3.5–5.2)
Alkaline Phosphatase: 45 U/L (ref 39–117)
CO2: 26 mEq/L (ref 19–32)
Chloride: 105 mEq/L (ref 96–112)
Creat: 1.21 mg/dL (ref 0.50–1.35)
Glucose, Bld: 93 mg/dL (ref 70–99)
Sodium: 142 mEq/L (ref 135–145)

## 2013-07-14 LAB — T-HELPER CELL (CD4) - (RCID CLINIC ONLY)
CD4 % Helper T Cell: 32 % — ABNORMAL LOW (ref 33–55)
CD4 T Cell Abs: 680 /uL (ref 400–2700)

## 2013-07-27 ENCOUNTER — Encounter: Payer: Self-pay | Admitting: Internal Medicine

## 2013-07-27 ENCOUNTER — Ambulatory Visit (INDEPENDENT_AMBULATORY_CARE_PROVIDER_SITE_OTHER): Payer: Private Health Insurance - Indemnity | Admitting: Internal Medicine

## 2013-07-27 VITALS — BP 114/74 | HR 93 | Temp 98.3°F | Ht 66.0 in | Wt 251.0 lb

## 2013-07-27 DIAGNOSIS — B20 Human immunodeficiency virus [HIV] disease: Secondary | ICD-10-CM

## 2013-07-27 DIAGNOSIS — Z23 Encounter for immunization: Secondary | ICD-10-CM

## 2013-07-27 NOTE — Progress Notes (Signed)
  Subjective:    Patient ID: Jesse Frank, male    DOB: April 28, 1981, 32 y.o.   MRN: [id number]  HPI He comes in for routine followup. I last saw him one year ago and he was on Atripla and I had him change to Stribild do to concentration difficulty at home. He started in has done well since. He denies any missed doses. He is pleased with the new regimen and noted a difference within a very short amount of time last year when he changed. He in fact has had a promotion at work and this doing well. No weight loss, no diarrhea. He did have followup labs after starting the medicine so I have not seen him since this time last year. Labs now are also reassuring with a CD4 count of 680 and   Review of Systems  Constitutional: Negative for fever, activity change and appetite change.  HENT: Negative for trouble swallowing.   Eyes: Negative for visual disturbance.  Respiratory: Negative for cough.   Gastrointestinal: Negative for nausea and diarrhea.  Musculoskeletal: Negative for back pain.  Skin: Negative for rash.  Neurological: Negative for dizziness and light-headedness.  Hematological: Negative for adenopathy.  Psychiatric/Behavioral: Negative for sleep disturbance.       Objective:   Physical Exam  Constitutional: He is oriented to person, place, and time. He appears well-developed and well-nourished. No distress.  HENT:  Mouth/Throat: No oropharyngeal exudate.  Eyes: Right eye exhibits no discharge. Left eye exhibits no discharge. No scleral icterus.  Cardiovascular: Normal rate, regular rhythm and normal heart sounds.   No murmur heard. Pulmonary/Chest: Effort normal and breath sounds normal. No respiratory distress. He has no wheezes.  Lymphadenopathy:    He has no cervical adenopathy.  Neurological: He is alert and oriented to person, place, and time.  Skin: No rash noted.  Psychiatric: He has a normal mood and affect. His behavior is normal.          Assessment & Plan:

## 2013-07-27 NOTE — Assessment & Plan Note (Signed)
Is doing well with the new regimen and will return again in 6 months. He knows to call if his problems in the meantime.

## 2013-09-27 ENCOUNTER — Other Ambulatory Visit: Payer: Self-pay | Admitting: *Deleted

## 2013-09-27 DIAGNOSIS — B20 Human immunodeficiency virus [HIV] disease: Secondary | ICD-10-CM

## 2013-09-27 MED ORDER — ELVITEG-COBIC-EMTRICIT-TENOFDF 150-150-200-300 MG PO TABS: ORAL_TABLET | ORAL | Status: DC

## 2014-01-25 ENCOUNTER — Other Ambulatory Visit: Payer: Private Health Insurance - Indemnity

## 2014-01-25 DIAGNOSIS — B20 Human immunodeficiency virus [HIV] disease: Secondary | ICD-10-CM

## 2014-01-26 LAB — T-HELPER CELL (CD4) - (RCID CLINIC ONLY)
CD4 T CELL ABS: 590 /uL (ref 400–2700)
CD4 T CELL HELPER: 32 % — AB (ref 33–55)

## 2014-01-26 LAB — HIV-1 RNA QUANT-NO REFLEX-BLD
HIV 1 RNA Quant: <20 copies/mL (ref <20)
HIV-1 RNA Quant, Log: <1.3 {Log} (ref <1.30)

## 2014-02-08 ENCOUNTER — Other Ambulatory Visit: Payer: Self-pay | Admitting: *Deleted

## 2014-02-08 ENCOUNTER — Ambulatory Visit (INDEPENDENT_AMBULATORY_CARE_PROVIDER_SITE_OTHER): Payer: Private Health Insurance - Indemnity | Admitting: Internal Medicine

## 2014-02-08 ENCOUNTER — Encounter: Payer: Self-pay | Admitting: Internal Medicine

## 2014-02-08 VITALS — BP 132/75 | HR 69 | Temp 98.5°F | Ht 67.0 in | Wt 237.0 lb

## 2014-02-08 DIAGNOSIS — Z113 Encounter for screening for infections with a predominantly sexual mode of transmission: Secondary | ICD-10-CM

## 2014-02-08 DIAGNOSIS — Z79899 Other long term (current) drug therapy: Secondary | ICD-10-CM

## 2014-02-08 DIAGNOSIS — B20 Human immunodeficiency virus [HIV] disease: Secondary | ICD-10-CM

## 2014-02-08 MED ORDER — ELVITEG-COBIC-EMTRICIT-TENOFDF 150-150-200-300 MG PO TABS: ORAL_TABLET | ORAL | Status: DC

## 2014-02-08 NOTE — Assessment & Plan Note (Signed)
Doing very well and no missed doses.  No issues.  RTC 6 months for fasting labs.  Offered condoms.

## 2014-02-08 NOTE — Progress Notes (Signed)
  Subjective:    Patient ID: Jesse Frank, male    DOB: May 16, 1981, 33 y.o.   MRN: [id number]  HPI  He comes in for routine followup.  He was previously on Atripla and I had him change to Stribild in 2014 due to concentration difficulty at home. He started in has done well since. He denies any missed doses. He is pleased with the new regimen and noted a difference within a very short amount of time last year when he changed. He in fact has had a promotion at work and this doing well. No weight loss, no diarrhea. CD4 590, viral load undetectable.     Review of Systems  Constitutional: Negative for appetite change.  HENT: Negative for trouble swallowing.   Gastrointestinal: Negative for nausea and diarrhea.  Skin: Negative for rash.  Neurological: Negative for dizziness and light-headedness.  Hematological: Negative for adenopathy.  Psychiatric/Behavioral: Negative for sleep disturbance.       Objective:   Physical Exam  Constitutional: He appears well-developed and well-nourished. No distress.  HENT:  Mouth/Throat: No oropharyngeal exudate.  Eyes: Right eye exhibits no discharge. Left eye exhibits no discharge. No scleral icterus.  Cardiovascular: Normal rate, regular rhythm and normal heart sounds.   No murmur heard. Pulmonary/Chest: Effort normal and breath sounds normal. No respiratory distress. He has no wheezes.  Lymphadenopathy:    He has no cervical adenopathy.  Skin: Skin is warm and dry. No rash noted.          Assessment & Plan:

## 2014-07-25 ENCOUNTER — Other Ambulatory Visit: Payer: Private Health Insurance - Indemnity

## 2014-07-26 ENCOUNTER — Other Ambulatory Visit: Payer: Private Health Insurance - Indemnity

## 2014-07-26 DIAGNOSIS — Z79899 Other long term (current) drug therapy: Secondary | ICD-10-CM

## 2014-07-26 DIAGNOSIS — B20 Human immunodeficiency virus [HIV] disease: Secondary | ICD-10-CM

## 2014-07-26 DIAGNOSIS — Z113 Encounter for screening for infections with a predominantly sexual mode of transmission: Secondary | ICD-10-CM

## 2014-07-26 LAB — LIPID PANEL
Cholesterol: 202 mg/dL — ABNORMAL HIGH (ref 0–200)
HDL: 34 mg/dL — ABNORMAL LOW (ref >39)
LDL CALC: 141 mg/dL — AB (ref 0–99)
TRIGLYCERIDES: 136 mg/dL (ref <150)
Total CHOL/HDL Ratio: 5.9 Ratio
VLDL: 27 mg/dL (ref 0–40)

## 2014-07-26 LAB — CBC WITH DIFFERENTIAL/PLATELET
BASOS ABS: 0.1 10*3/uL (ref 0.0–0.1)
Basophils Relative: 1 % (ref 0–1)
EOS ABS: 0.2 10*3/uL (ref 0.0–0.7)
EOS PCT: 3 % (ref 0–5)
HCT: 46.6 % (ref 39.0–52.0)
Hemoglobin: 16 g/dL (ref 13.0–17.0)
Lymphocytes Relative: 38 % (ref 12–46)
Lymphs Abs: 1.9 10*3/uL (ref 0.7–4.0)
MCH: 30.1 pg (ref 26.0–34.0)
MCHC: 34.3 g/dL (ref 30.0–36.0)
MCV: 87.6 fL (ref 78.0–100.0)
MPV: 10.2 fL (ref 9.4–12.4)
Monocytes Absolute: 0.4 10*3/uL (ref 0.1–1.0)
Monocytes Relative: 8 % (ref 3–12)
Neutro Abs: 2.5 10*3/uL (ref 1.7–7.7)
Neutrophils Relative %: 50 % (ref 43–77)
PLATELETS: 283 10*3/uL (ref 150–400)
RBC: 5.32 MIL/uL (ref 4.22–5.81)
RDW: 14.4 % (ref 11.5–15.5)
WBC: 5 10*3/uL (ref 4.0–10.5)

## 2014-07-26 LAB — COMPLETE METABOLIC PANEL WITH GFR
ALBUMIN: 4.2 g/dL (ref 3.5–5.2)
ALT: 25 U/L (ref 0–53)
AST: 18 U/L (ref 0–37)
Alkaline Phosphatase: 47 U/L (ref 39–117)
BUN: 14 mg/dL (ref 6–23)
CALCIUM: 9.4 mg/dL (ref 8.4–10.5)
CHLORIDE: 104 meq/L (ref 96–112)
CO2: 28 mEq/L (ref 19–32)
Creat: 1.23 mg/dL (ref 0.50–1.35)
GFR, Est African American: 89 mL/min
GFR, Est Non African American: 77 mL/min
Glucose, Bld: 90 mg/dL (ref 70–99)
POTASSIUM: 4.3 meq/L (ref 3.5–5.3)
Sodium: 139 mEq/L (ref 135–145)
Total Bilirubin: 0.5 mg/dL (ref 0.2–1.2)
Total Protein: 7.3 g/dL (ref 6.0–8.3)

## 2014-07-26 LAB — RPR

## 2014-07-27 LAB — HIV-1 RNA QUANT-NO REFLEX-BLD: HIV-1 RNA Quant, Log: <1.3 {Log} (ref <1.30)

## 2014-07-27 LAB — T-HELPER CELL (CD4) - (RCID CLINIC ONLY)
CD4 % Helper T Cell: 32 % — ABNORMAL LOW (ref 33–55)
CD4 T Cell Abs: 670 /uL (ref 400–2700)

## 2014-08-11 ENCOUNTER — Ambulatory Visit: Payer: Private Health Insurance - Indemnity | Admitting: Internal Medicine

## 2014-09-14 ENCOUNTER — Other Ambulatory Visit: Payer: Self-pay | Admitting: Internal Medicine

## 2014-10-28 ENCOUNTER — Other Ambulatory Visit: Payer: Self-pay | Admitting: Internal Medicine

## 2014-12-21 ENCOUNTER — Other Ambulatory Visit: Payer: Self-pay | Admitting: Internal Medicine

## 2014-12-27 ENCOUNTER — Other Ambulatory Visit: Payer: Private Health Insurance - Indemnity

## 2014-12-27 DIAGNOSIS — Z113 Encounter for screening for infections with a predominantly sexual mode of transmission: Secondary | ICD-10-CM

## 2014-12-27 DIAGNOSIS — B20 Human immunodeficiency virus [HIV] disease: Secondary | ICD-10-CM

## 2014-12-27 LAB — CBC WITH DIFFERENTIAL/PLATELET
Basophils Absolute: 0 10*3/uL (ref 0.0–0.1)
Basophils Relative: 1 % (ref 0–1)
EOS PCT: 2 % (ref 0–5)
Eosinophils Absolute: 0.1 10*3/uL (ref 0.0–0.7)
HEMATOCRIT: 46.3 % (ref 39.0–52.0)
Hemoglobin: 15.3 g/dL (ref 13.0–17.0)
LYMPHS ABS: 1.1 10*3/uL (ref 0.7–4.0)
Lymphocytes Relative: 26 % (ref 12–46)
MCH: 29.4 pg (ref 26.0–34.0)
MCHC: 33 g/dL (ref 30.0–36.0)
MCV: 89 fL (ref 78.0–100.0)
MPV: 9.9 fL (ref 8.6–12.4)
Monocytes Absolute: 0.6 10*3/uL (ref 0.1–1.0)
Monocytes Relative: 14 % — ABNORMAL HIGH (ref 3–12)
NEUTROS ABS: 2.4 10*3/uL (ref 1.7–7.7)
Neutrophils Relative %: 57 % (ref 43–77)
Platelets: 238 10*3/uL (ref 150–400)
RBC: 5.2 MIL/uL (ref 4.22–5.81)
RDW: 14.5 % (ref 11.5–15.5)
WBC: 4.2 10*3/uL (ref 4.0–10.5)

## 2014-12-27 LAB — COMPREHENSIVE METABOLIC PANEL
ALBUMIN: 3.9 g/dL (ref 3.5–5.2)
ALT: 30 U/L (ref 0–53)
AST: 21 U/L (ref 0–37)
Alkaline Phosphatase: 55 U/L (ref 39–117)
BUN: 12 mg/dL (ref 6–23)
CHLORIDE: 103 meq/L (ref 96–112)
CO2: 26 mEq/L (ref 19–32)
Calcium: 9.2 mg/dL (ref 8.4–10.5)
Creat: 1.04 mg/dL (ref 0.50–1.35)
GLUCOSE: 105 mg/dL — AB (ref 70–99)
POTASSIUM: 4.2 meq/L (ref 3.5–5.3)
Sodium: 139 mEq/L (ref 135–145)
TOTAL PROTEIN: 7.2 g/dL (ref 6.0–8.3)
Total Bilirubin: 0.5 mg/dL (ref 0.2–1.2)

## 2014-12-28 LAB — HIV-1 RNA QUANT-NO REFLEX-BLD
HIV 1 RNA Quant: <20 copies/mL (ref <20)
HIV-1 RNA Quant, Log: <1.3 {Log} (ref <1.30)

## 2014-12-28 LAB — URINE CYTOLOGY ANCILLARY ONLY
CHLAMYDIA, DNA PROBE: NEGATIVE
NEISSERIA GONORRHEA: NEGATIVE

## 2014-12-28 LAB — T-HELPER CELL (CD4) - (RCID CLINIC ONLY)
CD4 % Helper T Cell: 35 % (ref 33–55)
CD4 T Cell Abs: 400 /uL (ref 400–2700)

## 2015-01-10 ENCOUNTER — Ambulatory Visit (INDEPENDENT_AMBULATORY_CARE_PROVIDER_SITE_OTHER): Payer: Private Health Insurance - Indemnity | Admitting: Internal Medicine

## 2015-01-10 ENCOUNTER — Encounter: Payer: Self-pay | Admitting: Internal Medicine

## 2015-01-10 VITALS — BP 126/83 | HR 88 | Temp 99.4°F | Wt 229.0 lb

## 2015-01-10 DIAGNOSIS — B20 Human immunodeficiency virus [HIV] disease: Secondary | ICD-10-CM | POA: Diagnosis not present

## 2015-01-10 MED ORDER — ELVITEG-COBIC-EMTRICIT-TENOFAF 150-150-200-10 MG PO TABS: 1.0000 | ORAL_TABLET | Freq: Every day | ORAL | Status: DC

## 2015-01-10 NOTE — Assessment & Plan Note (Addendum)
He is doing well. [facility] and this will be done with his next refill. I will check his labs again on the new regimen in about 2 months. He was offered and given condoms.  He was given information to establish with a primary physician.

## 2015-01-10 NOTE — Progress Notes (Signed)
  Subjective:    Patient ID: Jesse Frank, male    DOB: [birth year], 34 y.o.   MRN: [id number]  HPI He comes in for routine followup.  He was previously on Atripla and I had him change to Stribild in 2014 due to concentration difficulty at home. He has done well with no missed doses. He had his last labs done in April with a CD4 count of 400 and a continued undetectable viral load. He was seen by me one year ago but had labs about 6 months ago which again were undetectable virus with a good CD4 count. No concerns.  Sexually active and needs condoms.  Some issues with abdominal discomfort and has appt today with GI.  Needs to establish with a PCP.     Review of Systems  Constitutional: Negative for appetite change.  HENT: Negative for trouble swallowing.   Gastrointestinal: Negative for nausea and diarrhea.  Genitourinary: Negative for discharge, penile swelling and genital sores.  Skin: Negative for rash.  Neurological: Negative for dizziness and light-headedness.  Hematological: Negative for adenopathy.  Psychiatric/Behavioral: Negative for sleep disturbance.       Objective:   Physical Exam  Constitutional: He appears well-developed and well-nourished. No distress.  HENT:  Mouth/Throat: No oropharyngeal exudate.  Eyes: Right eye exhibits no discharge. Left eye exhibits no discharge. No scleral icterus.  Cardiovascular: Normal rate, regular rhythm and normal heart sounds.   No murmur heard. Pulmonary/Chest: Effort normal and breath sounds normal. No respiratory distress. He has no wheezes.  Lymphadenopathy:    He has no cervical adenopathy.  Skin: Skin is warm and dry. No rash noted.          Assessment & Plan:

## 2015-02-28 ENCOUNTER — Other Ambulatory Visit: Payer: Private Health Insurance - Indemnity

## 2015-02-28 ENCOUNTER — Other Ambulatory Visit: Payer: Managed Care, Other (non HMO)

## 2015-02-28 DIAGNOSIS — B20 Human immunodeficiency virus [HIV] disease: Secondary | ICD-10-CM

## 2015-02-28 LAB — CBC WITH DIFFERENTIAL/PLATELET
Basophils Absolute: 0 10*3/uL (ref 0.0–0.1)
Basophils Relative: 0 % (ref 0–1)
Eosinophils Absolute: 0.2 10*3/uL (ref 0.0–0.7)
Eosinophils Relative: 4 % (ref 0–5)
HCT: 43.1 % (ref 39.0–52.0)
HEMOGLOBIN: 14.2 g/dL (ref 13.0–17.0)
LYMPHS PCT: 34 % (ref 12–46)
Lymphs Abs: 1.9 10*3/uL (ref 0.7–4.0)
MCH: 29.2 pg (ref 26.0–34.0)
MCHC: 32.9 g/dL (ref 30.0–36.0)
MCV: 88.5 fL (ref 78.0–100.0)
MPV: 10.3 fL (ref 8.6–12.4)
Monocytes Absolute: 0.6 10*3/uL (ref 0.1–1.0)
Monocytes Relative: 11 % (ref 3–12)
NEUTROS PCT: 51 % (ref 43–77)
Neutro Abs: 2.9 10*3/uL (ref 1.7–7.7)
Platelets: 269 10*3/uL (ref 150–400)
RBC: 4.87 MIL/uL (ref 4.22–5.81)
RDW: 14.2 % (ref 11.5–15.5)
WBC: 5.6 10*3/uL (ref 4.0–10.5)

## 2015-02-28 LAB — COMPLETE METABOLIC PANEL WITH GFR
ALBUMIN: 4.3 g/dL (ref 3.5–5.2)
ALK PHOS: 47 U/L (ref 39–117)
ALT: 35 U/L (ref 0–53)
AST: 34 U/L (ref 0–37)
BUN: 14 mg/dL (ref 6–23)
CALCIUM: 9.2 mg/dL (ref 8.4–10.5)
CHLORIDE: 104 meq/L (ref 96–112)
CO2: 26 mEq/L (ref 19–32)
CREATININE: 1.12 mg/dL (ref 0.50–1.35)
GFR, EST NON AFRICAN AMERICAN: 86 mL/min
GFR, Est African American: >89 mL/min
GLUCOSE: 83 mg/dL (ref 70–99)
Potassium: 4.5 mEq/L (ref 3.5–5.3)
SODIUM: 139 meq/L (ref 135–145)
Total Bilirubin: 0.5 mg/dL (ref 0.2–1.2)
Total Protein: 7 g/dL (ref 6.0–8.3)

## 2015-03-01 ENCOUNTER — Other Ambulatory Visit: Payer: Managed Care, Other (non HMO)

## 2015-03-01 LAB — HIV-1 RNA QUANT-NO REFLEX-BLD
HIV 1 RNA Quant: <20 copies/mL (ref <20)
HIV-1 RNA Quant, Log: <1.3 {Log} (ref <1.30)

## 2015-03-02 LAB — T-HELPER CELL (CD4) - (RCID CLINIC ONLY)
CD4 % Helper T Cell: 36 % (ref 33–55)
CD4 T Cell Abs: 690 /uL (ref 400–2700)

## 2015-03-13 ENCOUNTER — Telehealth: Payer: Self-pay | Admitting: *Deleted

## 2015-03-13 NOTE — Telephone Encounter (Signed)
Had to reschedule appt with Dr. Luciana Axeomer until August.  Wondering how his new medication is working after change in May.  Shared recent lab results.  Pt plans to keep f/u OV for August.

## 2015-03-14 ENCOUNTER — Ambulatory Visit: Payer: Private Health Insurance - Indemnity | Admitting: Internal Medicine

## 2015-04-11 ENCOUNTER — Ambulatory Visit (INDEPENDENT_AMBULATORY_CARE_PROVIDER_SITE_OTHER): Payer: Managed Care, Other (non HMO) | Admitting: Internal Medicine

## 2015-04-11 ENCOUNTER — Encounter: Payer: Self-pay | Admitting: Internal Medicine

## 2015-04-11 VITALS — BP 123/81 | HR 93 | Temp 98.5°F | Ht 67.0 in | Wt 248.0 lb

## 2015-04-11 DIAGNOSIS — R369 Urethral discharge, unspecified: Secondary | ICD-10-CM | POA: Diagnosis not present

## 2015-04-11 DIAGNOSIS — Z113 Encounter for screening for infections with a predominantly sexual mode of transmission: Secondary | ICD-10-CM | POA: Diagnosis not present

## 2015-04-11 DIAGNOSIS — B20 Human immunodeficiency virus [HIV] disease: Secondary | ICD-10-CM | POA: Diagnosis not present

## 2015-04-11 DIAGNOSIS — Z79899 Other long term (current) drug therapy: Secondary | ICD-10-CM

## 2015-04-11 NOTE — Progress Notes (Signed)
  Subjective:    Patient ID: Jesse Frank, male    DOB: 1981/06/26, 34 y.o.   MRN: [id number]  HPI He comes in for routine followup.  He was previously on Atripla and I had him change to Stribild in 2014 due to concentration difficulty at home and to [place] last visit. He has done well with no missed doses. Labs done shlow a CD4 count of 690 and a continued undetectable viral load. No concerns.  Some recent penile discharge.  No burning, no pyuria.  No weight loss.  Exercising, trying to stay healthy.     Review of Systems  Constitutional: Negative for appetite change.  HENT: Negative for trouble swallowing.   Gastrointestinal: Negative for nausea and diarrhea.  Genitourinary: Negative for discharge, penile swelling and genital sores.  Skin: Negative for rash.  Neurological: Negative for dizziness and light-headedness.  Hematological: Negative for adenopathy.  Psychiatric/Behavioral: Negative for sleep disturbance.       Objective:   Physical Exam  Constitutional: He appears well-developed and well-nourished. No distress.  HENT:  Mouth/Throat: No oropharyngeal exudate.  Eyes: Right eye exhibits no discharge. Left eye exhibits no discharge. No scleral icterus.  Cardiovascular: Normal rate, regular rhythm and normal heart sounds.   No murmur heard. Pulmonary/Chest: Effort normal and breath sounds normal. No respiratory distress. He has no wheezes.  Lymphadenopathy:    He has no cervical adenopathy.  Skin: Skin is warm and dry. No rash noted.          Assessment & Plan:

## 2015-04-11 NOTE — Assessment & Plan Note (Signed)
Doing well, rtc 6 months unless concerns.  Labs reviewed with patient.

## 2015-04-11 NOTE — Assessment & Plan Note (Signed)
Will empirically treat for GC and chlamydia and test.

## 2015-04-12 LAB — URINE CYTOLOGY ANCILLARY ONLY
Chlamydia: NEGATIVE
NEISSERIA GONORRHEA: POSITIVE — AB

## 2015-04-12 LAB — RPR

## 2015-04-18 MED ORDER — CEFTRIAXONE SODIUM 1 G IJ SOLR
250.0000 mg | Freq: Once | INTRAMUSCULAR | Status: AC
  Administered 2015-04-11: 250 mg via INTRAMUSCULAR

## 2015-04-18 MED ORDER — AZITHROMYCIN 250 MG PO TABS
1000.0000 mg | ORAL_TABLET | Freq: Once | ORAL | Status: AC
  Administered 2015-04-11: 1000 mg via ORAL

## 2015-04-18 NOTE — Addendum Note (Signed)
Addended by: Andree Coss on: 04/18/2015 12:08 PM   Modules accepted: Orders

## 2015-06-23 ENCOUNTER — Other Ambulatory Visit: Payer: Self-pay | Admitting: Internal Medicine

## 2015-10-09 ENCOUNTER — Other Ambulatory Visit: Payer: Managed Care, Other (non HMO)

## 2015-10-09 DIAGNOSIS — Z113 Encounter for screening for infections with a predominantly sexual mode of transmission: Secondary | ICD-10-CM

## 2015-10-09 DIAGNOSIS — Z79899 Other long term (current) drug therapy: Secondary | ICD-10-CM

## 2015-10-09 DIAGNOSIS — B20 Human immunodeficiency virus [HIV] disease: Secondary | ICD-10-CM

## 2015-10-09 LAB — CBC WITH DIFFERENTIAL/PLATELET
BASOS PCT: 1 % (ref 0–1)
Basophils Absolute: 0.1 10*3/uL (ref 0.0–0.1)
Eosinophils Absolute: 0.1 10*3/uL (ref 0.0–0.7)
Eosinophils Relative: 2 % (ref 0–5)
HEMATOCRIT: 47 % (ref 39.0–52.0)
HEMOGLOBIN: 15.5 g/dL (ref 13.0–17.0)
LYMPHS PCT: 39 % (ref 12–46)
Lymphs Abs: 2 10*3/uL (ref 0.7–4.0)
MCH: 29.6 pg (ref 26.0–34.0)
MCHC: 33 g/dL (ref 30.0–36.0)
MCV: 89.7 fL (ref 78.0–100.0)
MONO ABS: 0.4 10*3/uL (ref 0.1–1.0)
MONOS PCT: 8 % (ref 3–12)
MPV: 9.8 fL (ref 8.6–12.4)
NEUTROS ABS: 2.5 10*3/uL (ref 1.7–7.7)
NEUTROS PCT: 50 % (ref 43–77)
Platelets: 272 10*3/uL (ref 150–400)
RBC: 5.24 MIL/uL (ref 4.22–5.81)
RDW: 14 % (ref 11.5–15.5)
WBC: 5 10*3/uL (ref 4.0–10.5)

## 2015-10-09 LAB — COMPLETE METABOLIC PANEL WITH GFR
ALT: 26 U/L (ref 9–46)
AST: 19 U/L (ref 10–40)
Albumin: 4 g/dL (ref 3.6–5.1)
Alkaline Phosphatase: 39 U/L — ABNORMAL LOW (ref 40–115)
BUN: 14 mg/dL (ref 7–25)
CHLORIDE: 105 mmol/L (ref 98–110)
CO2: 27 mmol/L (ref 20–31)
Calcium: 9.6 mg/dL (ref 8.6–10.3)
Creat: 1.15 mg/dL (ref 0.60–1.35)
GFR, EST NON AFRICAN AMERICAN: 83 mL/min (ref >=60)
GFR, Est African American: >89 mL/min (ref >=60)
GLUCOSE: 91 mg/dL (ref 65–99)
POTASSIUM: 4.9 mmol/L (ref 3.5–5.3)
SODIUM: 141 mmol/L (ref 135–146)
Total Bilirubin: 0.4 mg/dL (ref 0.2–1.2)
Total Protein: 7.2 g/dL (ref 6.1–8.1)

## 2015-10-09 LAB — LIPID PANEL
CHOL/HDL RATIO: 5.2 ratio — AB (ref <=5.0)
CHOLESTEROL: 199 mg/dL (ref 125–200)
HDL: 38 mg/dL — ABNORMAL LOW (ref >=40)
LDL CALC: 143 mg/dL — AB (ref <130)
TRIGLYCERIDES: 90 mg/dL (ref <150)
VLDL: 18 mg/dL (ref <30)

## 2015-10-10 LAB — T-HELPER CELL (CD4) - (RCID CLINIC ONLY)
CD4 T CELL ABS: 700 /uL (ref 400–2700)
CD4 T CELL HELPER: 36 % (ref 33–55)

## 2015-10-10 LAB — HIV-1 RNA QUANT-NO REFLEX-BLD
HIV 1 RNA Quant: <20 copies/mL (ref <20)
HIV-1 RNA Quant, Log: <1.3 Log copies/mL (ref <1.30)

## 2015-10-10 LAB — URINE CYTOLOGY ANCILLARY ONLY
Chlamydia: NEGATIVE
Neisseria Gonorrhea: NEGATIVE

## 2015-10-10 LAB — RPR

## 2015-10-24 ENCOUNTER — Ambulatory Visit (INDEPENDENT_AMBULATORY_CARE_PROVIDER_SITE_OTHER): Payer: Managed Care, Other (non HMO) | Admitting: Internal Medicine

## 2015-10-24 ENCOUNTER — Encounter: Payer: Self-pay | Admitting: Internal Medicine

## 2015-10-24 VITALS — BP 114/74 | HR 91 | Temp 99.0°F | Ht 67.0 in | Wt 245.0 lb

## 2015-10-24 DIAGNOSIS — R369 Urethral discharge, unspecified: Secondary | ICD-10-CM | POA: Diagnosis not present

## 2015-10-24 DIAGNOSIS — B20 Human immunodeficiency virus [HIV] disease: Secondary | ICD-10-CM

## 2015-10-24 DIAGNOSIS — Z23 Encounter for immunization: Secondary | ICD-10-CM | POA: Diagnosis not present

## 2015-10-24 NOTE — Assessment & Plan Note (Signed)
Doing great on his regimen.  rtc 6 months.

## 2015-10-24 NOTE — Assessment & Plan Note (Signed)
Resolved. Has condoms

## 2015-10-24 NOTE — Progress Notes (Signed)
  Subjective:    Patient ID: Jesse Frank, male    DOB: 08/08/1981, 35 y.o.   MRN: [id number]  HPI He comes in for routine followup for HIV.    He was previously on Atripla and I had him change to Stribild in 2014 due to concentration difficulty at home and to [place] last year. He has done well with no missed doses. Labs done shlow a CD4 count of 700 and a continued undetectable viral load. No concerns.  Had gonorrhea last visit, treated and now repeat is negative.    Exercising, trying to stay healthy.     Review of Systems  HENT: Negative for trouble swallowing.   Gastrointestinal: Negative for nausea and diarrhea.  Genitourinary: Negative for discharge, penile swelling and genital sores.  Skin: Negative for rash.  Neurological: Negative for dizziness and light-headedness.       Objective:   Physical Exam  Constitutional: He appears well-developed and well-nourished. No distress.  HENT:  Mouth/Throat: No oropharyngeal exudate.  Eyes: Right eye exhibits no discharge. Left eye exhibits no discharge. No scleral icterus.  Cardiovascular: Normal rate, regular rhythm and normal heart sounds.   No murmur heard. Pulmonary/Chest: Effort normal and breath sounds normal. No respiratory distress. He has no wheezes.  Lymphadenopathy:    He has no cervical adenopathy.  Skin: No rash noted.          Assessment & Plan:

## 2015-12-14 ENCOUNTER — Other Ambulatory Visit: Payer: Self-pay | Admitting: Internal Medicine

## 2016-04-29 ENCOUNTER — Other Ambulatory Visit: Payer: Managed Care, Other (non HMO)

## 2016-04-29 DIAGNOSIS — B20 Human immunodeficiency virus [HIV] disease: Secondary | ICD-10-CM

## 2016-04-30 LAB — T-HELPER CELL (CD4) - (RCID CLINIC ONLY)
CD4 % Helper T Cell: 37 % (ref 33–55)
CD4 T CELL ABS: 840 /uL (ref 400–2700)

## 2016-04-30 LAB — HIV-1 RNA QUANT-NO REFLEX-BLD
HIV 1 RNA Quant: <20 copies/mL (ref <20)
HIV-1 RNA Quant, Log: <1.3 Log copies/mL (ref <1.30)

## 2016-05-09 ENCOUNTER — Ambulatory Visit (INDEPENDENT_AMBULATORY_CARE_PROVIDER_SITE_OTHER): Payer: Managed Care, Other (non HMO) | Admitting: Internal Medicine

## 2016-05-09 ENCOUNTER — Encounter: Payer: Self-pay | Admitting: Internal Medicine

## 2016-05-09 VITALS — BP 113/74 | HR 71 | Temp 98.2°F | Wt 254.0 lb

## 2016-05-09 DIAGNOSIS — E669 Obesity, unspecified: Secondary | ICD-10-CM

## 2016-05-09 DIAGNOSIS — Z23 Encounter for immunization: Secondary | ICD-10-CM

## 2016-05-09 DIAGNOSIS — Z113 Encounter for screening for infections with a predominantly sexual mode of transmission: Secondary | ICD-10-CM | POA: Diagnosis not present

## 2016-05-09 DIAGNOSIS — B20 Human immunodeficiency virus [HIV] disease: Secondary | ICD-10-CM | POA: Diagnosis not present

## 2016-05-09 DIAGNOSIS — Z79899 Other long term (current) drug therapy: Secondary | ICD-10-CM | POA: Diagnosis not present

## 2016-05-09 NOTE — Progress Notes (Signed)
  Subjective:    Patient ID: Jesse Frank, male    DOB: 1981/01/19, 35 y.o.   MRN: [id number]  HPI He comes in for routine followup for HIV.    He was previously on Atripla and I had him change to Stribild in 2014 due to concentration difficulty at home and to [place] last year. He has done well with no missed doses. Labs done shlow a CD4 count of 840 and a continued undetectable viral load. No concerns.     Exercising more now, not using any supplements, trying to stay healthy.     Review of Systems  HENT: Negative for trouble swallowing.   Gastrointestinal: Negative for diarrhea and nausea.  Genitourinary: Negative for discharge, genital sores and penile swelling.  Skin: Negative for rash.  Neurological: Negative for dizziness and light-headedness.       Objective:   Physical Exam  Constitutional: He appears well-developed and well-nourished. No distress.  HENT:  Mouth/Throat: No oropharyngeal exudate.  Eyes: Right eye exhibits no discharge. Left eye exhibits no discharge. No scleral icterus.  Cardiovascular: Normal rate, regular rhythm and normal heart sounds.   No murmur heard. Pulmonary/Chest: Effort normal and breath sounds normal. No respiratory distress. He has no wheezes.  Lymphadenopathy:    He has no cervical adenopathy.  Skin: No rash noted.          Assessment & Plan:

## 2016-05-09 NOTE — Assessment & Plan Note (Signed)
Doing good and no issues.  RTC 6 months.

## 2016-05-09 NOTE — Assessment & Plan Note (Signed)
Encouraged continued efforts at weight loss 

## 2016-07-05 ENCOUNTER — Other Ambulatory Visit: Payer: Self-pay | Admitting: Internal Medicine

## 2016-11-05 ENCOUNTER — Other Ambulatory Visit (INDEPENDENT_AMBULATORY_CARE_PROVIDER_SITE_OTHER): Payer: 59

## 2016-11-05 DIAGNOSIS — Z113 Encounter for screening for infections with a predominantly sexual mode of transmission: Secondary | ICD-10-CM

## 2016-11-05 DIAGNOSIS — B20 Human immunodeficiency virus [HIV] disease: Secondary | ICD-10-CM

## 2016-11-05 DIAGNOSIS — Z79899 Other long term (current) drug therapy: Secondary | ICD-10-CM

## 2016-11-05 LAB — COMPLETE METABOLIC PANEL WITH GFR
ALBUMIN: 4 g/dL (ref 3.6–5.1)
ALK PHOS: 45 U/L (ref 40–115)
ALT: 24 U/L (ref 9–46)
AST: 30 U/L (ref 10–40)
BUN: 9 mg/dL (ref 7–25)
CALCIUM: 9.1 mg/dL (ref 8.6–10.3)
CO2: 24 mmol/L (ref 20–31)
Chloride: 107 mmol/L (ref 98–110)
Creat: 1.17 mg/dL (ref 0.60–1.35)
GFR, EST NON AFRICAN AMERICAN: 80 mL/min (ref >=60)
GFR, Est African American: >89 mL/min (ref >=60)
Glucose, Bld: 91 mg/dL (ref 65–99)
POTASSIUM: 4.1 mmol/L (ref 3.5–5.3)
Sodium: 141 mmol/L (ref 135–146)
Total Bilirubin: 0.5 mg/dL (ref 0.2–1.2)
Total Protein: 6.8 g/dL (ref 6.1–8.1)

## 2016-11-05 LAB — CBC WITH DIFFERENTIAL/PLATELET
Basophils Absolute: 43 cells/uL (ref 0–200)
Basophils Relative: 1 %
EOS PCT: 2 %
Eosinophils Absolute: 86 cells/uL (ref 15–500)
HCT: 44.4 % (ref 38.5–50.0)
HEMOGLOBIN: 14.6 g/dL (ref 13.2–17.1)
LYMPHS ABS: 1677 {cells}/uL (ref 850–3900)
Lymphocytes Relative: 39 %
MCH: 29.1 pg (ref 27.0–33.0)
MCHC: 32.9 g/dL (ref 32.0–36.0)
MCV: 88.4 fL (ref 80.0–100.0)
MONOS PCT: 10 %
MPV: 9.7 fL (ref 7.5–12.5)
Monocytes Absolute: 430 cells/uL (ref 200–950)
NEUTROS ABS: 2064 {cells}/uL (ref 1500–7800)
NEUTROS PCT: 48 %
PLATELETS: 229 10*3/uL (ref 140–400)
RBC: 5.02 MIL/uL (ref 4.20–5.80)
RDW: 14.9 % (ref 11.0–15.0)
WBC: 4.3 10*3/uL (ref 3.8–10.8)

## 2016-11-05 LAB — LIPID PANEL
Cholesterol: 187 mg/dL (ref <200)
HDL: 34 mg/dL — ABNORMAL LOW (ref >40)
LDL Cholesterol: 130 mg/dL — ABNORMAL HIGH (ref <100)
Total CHOL/HDL Ratio: 5.5 Ratio — ABNORMAL HIGH (ref <5.0)
Triglycerides: 114 mg/dL (ref <150)
VLDL: 23 mg/dL (ref <30)

## 2016-11-06 LAB — RPR

## 2016-11-06 LAB — T-HELPER CELL (CD4) - (RCID CLINIC ONLY)
CD4 % Helper T Cell: 36 % (ref 33–55)
CD4 T CELL ABS: 680 /uL (ref 400–2700)

## 2016-11-08 LAB — HIV-1 RNA QUANT-NO REFLEX-BLD
HIV 1 RNA QUANT: NOT DETECTED {copies}/mL
HIV-1 RNA QUANT, LOG: NOT DETECTED {Log_copies}/mL

## 2016-11-19 ENCOUNTER — Ambulatory Visit (INDEPENDENT_AMBULATORY_CARE_PROVIDER_SITE_OTHER): Payer: 59 | Admitting: Internal Medicine

## 2016-11-19 ENCOUNTER — Encounter: Payer: Self-pay | Admitting: Internal Medicine

## 2016-11-19 VITALS — BP 113/77 | HR 88 | Temp 98.7°F | Ht 67.0 in | Wt 257.0 lb

## 2016-11-19 DIAGNOSIS — Z79899 Other long term (current) drug therapy: Secondary | ICD-10-CM

## 2016-11-19 DIAGNOSIS — B20 Human immunodeficiency virus [HIV] disease: Secondary | ICD-10-CM

## 2016-11-19 NOTE — Progress Notes (Signed)
  Subjective:    Patient ID: Jesse Frank, male    DOB: 10-Jul-1981, 36 y.o.   MRN: [id number]  HPI He comes in for routine followup for HIV.    He continues to take Genvoya and tolerating well. He has done well with no missed doses. Labs done show a CD4 count of 680 and a continued undetectable viral load. No concerns.     Exercising more now.  Finished his Master's and may move later this year.     Review of Systems  Gastrointestinal: Negative for diarrhea and nausea.  Skin: Negative for rash.  Neurological: Negative for dizziness.       Objective:   Physical Exam  Constitutional: He appears well-developed and well-nourished. No distress.  HENT:  Mouth/Throat: No oropharyngeal exudate.  Cardiovascular: Normal rate, regular rhythm and normal heart sounds.   No murmur heard. Pulmonary/Chest: Effort normal and breath sounds normal. No respiratory distress.  Lymphadenopathy:    He has no cervical adenopathy.  Skin: No rash noted.          Assessment & Plan:

## 2016-11-19 NOTE — Assessment & Plan Note (Signed)
diong well.  No changes.  rtc 6 months.  He will inform us if he moves.

## 2016-11-19 NOTE — Assessment & Plan Note (Signed)
Good creat, LFTs, lipids

## 2017-01-31 ENCOUNTER — Other Ambulatory Visit: Payer: Self-pay | Admitting: Internal Medicine

## 2017-04-21 ENCOUNTER — Other Ambulatory Visit: Payer: 59

## 2017-04-21 DIAGNOSIS — B20 Human immunodeficiency virus [HIV] disease: Secondary | ICD-10-CM

## 2017-04-22 LAB — T-HELPER CELL (CD4) - (RCID CLINIC ONLY)
CD4 T CELL ABS: 650 /uL (ref 400–2700)
CD4 T CELL HELPER: 36 % (ref 33–55)

## 2017-04-23 LAB — HIV-1 RNA QUANT-NO REFLEX-BLD
HIV 1 RNA QUANT: NOT DETECTED {copies}/mL
HIV-1 RNA QUANT, LOG: NOT DETECTED {Log_copies}/mL

## 2017-05-08 ENCOUNTER — Other Ambulatory Visit (HOSPITAL_COMMUNITY)
Admission: RE | Admit: 2017-05-08 | Discharge: 2017-05-08 | Disposition: A | Discharge status: Home / Self Care | Payer: 59 | Source: Ambulatory Visit | Attending: Internal Medicine | Admitting: Internal Medicine

## 2017-05-08 ENCOUNTER — Encounter: Payer: Self-pay | Admitting: Internal Medicine

## 2017-05-08 ENCOUNTER — Ambulatory Visit (INDEPENDENT_AMBULATORY_CARE_PROVIDER_SITE_OTHER): Payer: 59 | Admitting: Internal Medicine

## 2017-05-08 VITALS — BP 134/89 | HR 77 | Ht 67.0 in | Wt 255.0 lb

## 2017-05-08 DIAGNOSIS — Z113 Encounter for screening for infections with a predominantly sexual mode of transmission: Secondary | ICD-10-CM

## 2017-05-08 DIAGNOSIS — Z23 Encounter for immunization: Secondary | ICD-10-CM

## 2017-05-08 DIAGNOSIS — Z7185 Encounter for immunization safety counseling: Secondary | ICD-10-CM

## 2017-05-08 DIAGNOSIS — Z7189 Other specified counseling: Secondary | ICD-10-CM

## 2017-05-08 DIAGNOSIS — Z202 Contact with and (suspected) exposure to infections with a predominantly sexual mode of transmission: Secondary | ICD-10-CM

## 2017-05-08 DIAGNOSIS — Z79899 Other long term (current) drug therapy: Secondary | ICD-10-CM

## 2017-05-08 DIAGNOSIS — B20 Human immunodeficiency virus [HIV] disease: Secondary | ICD-10-CM | POA: Diagnosis not present

## 2017-05-08 NOTE — Assessment & Plan Note (Signed)
Will screen today 

## 2017-05-08 NOTE — Progress Notes (Signed)
   Subjective:    Patient ID: Jesse Frank, Jesse Frank    DOB: 03/18/1981, 36 y.o.   MRN: [id number]  HPI Here for HIV follow up.  Has been on Genvoya and denies anymissed doses.  CD4 of 650, viral load < 20.  No associated n/v/d.  No weight loss.  Was exposed to syphilis and treated empirically.  No symptoms now including no penile discharge, warts, chancre.  No rash.    Review of Systems  Constitutional: Negative for appetite change.  Gastrointestinal: Negative for diarrhea.  Skin: Negative for rash.       Objective:   Physical Exam  Constitutional: He appears well-developed and well-nourished. No distress.  Eyes: No scleral icterus.  Cardiovascular: Normal rate, regular rhythm and normal heart sounds.   No murmur heard. Pulmonary/Chest: Breath sounds normal. No respiratory distress.  Lymphadenopathy:    He has no cervical adenopathy.  Skin: No rash noted.   SH: sexually active with condoms always       Assessment & Plan:

## 2017-05-08 NOTE — Assessment & Plan Note (Signed)
Will check titer today

## 2017-05-08 NOTE — Assessment & Plan Note (Signed)
Doing well.  rtc 6 months.  

## 2017-05-08 NOTE — Assessment & Plan Note (Signed)
Counseled on Menveo and given today

## 2017-05-09 LAB — RPR: RPR: NONREACTIVE

## 2017-05-09 LAB — CYTOLOGY, (ORAL, ANAL, URETHRAL) ANCILLARY ONLY
Chlamydia: NEGATIVE
Chlamydia: NEGATIVE
NEISSERIA GONORRHEA: NEGATIVE
NEISSERIA GONORRHEA: NEGATIVE

## 2017-05-09 LAB — URINE CYTOLOGY ANCILLARY ONLY
Chlamydia: NEGATIVE
Neisseria Gonorrhea: NEGATIVE

## 2017-05-13 NOTE — Addendum Note (Signed)
Addended by: Andree CossHOWELL, Jadyn Barge M on: 05/13/2017 03:48 PM   Modules accepted: Orders

## 2017-11-11 ENCOUNTER — Other Ambulatory Visit (HOSPITAL_COMMUNITY)
Admission: RE | Admit: 2017-11-11 | Discharge: 2017-11-11 | Disposition: A | Discharge status: Home / Self Care | Payer: 59 | Source: Ambulatory Visit | Attending: Internal Medicine | Admitting: Internal Medicine

## 2017-11-11 ENCOUNTER — Other Ambulatory Visit: Payer: 59

## 2017-11-11 DIAGNOSIS — Z79899 Other long term (current) drug therapy: Secondary | ICD-10-CM

## 2017-11-11 DIAGNOSIS — B9689 Other specified bacterial agents as the cause of diseases classified elsewhere: Secondary | ICD-10-CM | POA: Diagnosis not present

## 2017-11-11 DIAGNOSIS — Z113 Encounter for screening for infections with a predominantly sexual mode of transmission: Secondary | ICD-10-CM

## 2017-11-11 DIAGNOSIS — B20 Human immunodeficiency virus [HIV] disease: Secondary | ICD-10-CM | POA: Insufficient documentation

## 2017-11-11 DIAGNOSIS — A749 Chlamydial infection, unspecified: Secondary | ICD-10-CM | POA: Diagnosis not present

## 2017-11-12 LAB — LIPID PANEL
CHOL/HDL RATIO: 6.6 (calc) — AB (ref <5.0)
Cholesterol: 237 mg/dL — ABNORMAL HIGH (ref <200)
HDL: 36 mg/dL — ABNORMAL LOW (ref >40)
LDL CHOLESTEROL (CALC): 177 mg/dL — AB
Non-HDL Cholesterol (Calc): 201 mg/dL (calc) — ABNORMAL HIGH (ref <130)
Triglycerides: 111 mg/dL (ref <150)

## 2017-11-12 LAB — COMPLETE METABOLIC PANEL WITH GFR
AG Ratio: 1.3 (calc) (ref 1.0–2.5)
ALKALINE PHOSPHATASE (APISO): 48 U/L (ref 40–115)
ALT: 23 U/L (ref 9–46)
AST: 19 U/L (ref 10–40)
Albumin: 4.4 g/dL (ref 3.6–5.1)
BUN: 13 mg/dL (ref 7–25)
CO2: 28 mmol/L (ref 20–32)
Calcium: 9.8 mg/dL (ref 8.6–10.3)
Chloride: 106 mmol/L (ref 98–110)
Creat: 1.3 mg/dL (ref 0.60–1.35)
GFR, EST AFRICAN AMERICAN: 81 mL/min/{1.73_m2} (ref >=60)
GFR, EST NON AFRICAN AMERICAN: 70 mL/min/{1.73_m2} (ref >=60)
GLOBULIN: 3.5 g/dL (ref 1.9–3.7)
GLUCOSE: 103 mg/dL — AB (ref 65–99)
POTASSIUM: 4.4 mmol/L (ref 3.5–5.3)
Sodium: 142 mmol/L (ref 135–146)
Total Bilirubin: 0.8 mg/dL (ref 0.2–1.2)
Total Protein: 7.9 g/dL (ref 6.1–8.1)

## 2017-11-12 LAB — CBC WITH DIFFERENTIAL/PLATELET
BASOS ABS: 41 {cells}/uL (ref 0–200)
Basophils Relative: 0.7 %
EOS PCT: 1.4 %
Eosinophils Absolute: 81 cells/uL (ref 15–500)
HCT: 46.6 % (ref 38.5–50.0)
Hemoglobin: 16.1 g/dL (ref 13.2–17.1)
Lymphs Abs: 2442 cells/uL (ref 850–3900)
MCH: 30.3 pg (ref 27.0–33.0)
MCHC: 34.5 g/dL (ref 32.0–36.0)
MCV: 87.6 fL (ref 80.0–100.0)
MONOS PCT: 7.4 %
MPV: 9.8 fL (ref 7.5–12.5)
NEUTROS PCT: 48.4 %
Neutro Abs: 2807 cells/uL (ref 1500–7800)
Platelets: 282 10*3/uL (ref 140–400)
RBC: 5.32 10*6/uL (ref 4.20–5.80)
RDW: 13.5 % (ref 11.0–15.0)
Total Lymphocyte: 42.1 %
WBC mixed population: 429 cells/uL (ref 200–950)
WBC: 5.8 10*3/uL (ref 3.8–10.8)

## 2017-11-12 LAB — RPR: RPR: NONREACTIVE

## 2017-11-12 LAB — T-HELPER CELL (CD4) - (RCID CLINIC ONLY)
CD4 T CELL ABS: 1000 /uL (ref 400–2700)
CD4 T CELL HELPER: 39 % (ref 33–55)

## 2017-11-12 LAB — URINE CYTOLOGY ANCILLARY ONLY
Chlamydia: POSITIVE — AB
Neisseria Gonorrhea: NEGATIVE

## 2017-11-13 ENCOUNTER — Telehealth: Payer: Self-pay | Admitting: *Deleted

## 2017-11-13 ENCOUNTER — Other Ambulatory Visit: Payer: Self-pay | Admitting: Internal Medicine

## 2017-11-13 LAB — HIV-1 RNA QUANT-NO REFLEX-BLD
HIV 1 RNA Quant: <20 copies/mL
HIV-1 RNA QUANT, LOG: NOT DETECTED {Log_copies}/mL

## 2017-11-13 MED ORDER — AZITHROMYCIN 500 MG PO TABS: 1000.0000 mg | ORAL_TABLET | Freq: Once | ORAL | 0 refills | Status: AC

## 2017-11-13 NOTE — Telephone Encounter (Signed)
-----   Message from Gardiner Barefootobert W Comer, MD sent at 11/13/2017  7:24 AM EDT ----- Positive chlamydia.  Azithro sent to his pharmacy if you could let him know.  thanks

## 2017-11-13 NOTE — Telephone Encounter (Signed)
Notified patient. He will pick up the medication from [facility] today.  Andree CossHowell, Letina Luckett M, RN

## 2017-11-25 ENCOUNTER — Ambulatory Visit: Payer: 59 | Admitting: Internal Medicine

## 2017-11-25 ENCOUNTER — Encounter: Payer: Self-pay | Admitting: Internal Medicine

## 2017-11-25 VITALS — BP 124/68 | HR 82 | Temp 98.5°F | Ht 66.0 in | Wt 263.0 lb

## 2017-11-25 DIAGNOSIS — Z113 Encounter for screening for infections with a predominantly sexual mode of transmission: Secondary | ICD-10-CM

## 2017-11-25 DIAGNOSIS — B20 Human immunodeficiency virus [HIV] disease: Secondary | ICD-10-CM

## 2017-11-25 DIAGNOSIS — Z79899 Other long term (current) drug therapy: Secondary | ICD-10-CM

## 2017-11-25 NOTE — Assessment & Plan Note (Signed)
Doing well.  Will continue with 6 month follow up.

## 2017-11-25 NOTE — Progress Notes (Signed)
   Subjective:    Patient ID: Jesse Frank, male    DOB: 09/10/1980, 37 y.o.   MRN: [id number]  HPI Here for HIV follow up.  Has been on Genvoya and denies anymissed doses.  CD4 of 1000, viral load < 20.  No associated n/d.  No weight loss.  No new issues.  Exposed to syphilis last year but remains negative. LDL 177.    Review of Systems  Constitutional: Negative for appetite change.  Gastrointestinal: Negative for diarrhea.  Skin: Negative for rash.       Objective:   Physical Exam  Constitutional: He appears well-developed and well-nourished. No distress.  Eyes: No scleral icterus.  Cardiovascular: Normal rate, regular rhythm and normal heart sounds.  No murmur heard. Pulmonary/Chest: Breath sounds normal. No respiratory distress.  Lymphadenopathy:    He has no cervical adenopathy.  Skin: No rash noted.   SH: sexually active with condoms always       Assessment & Plan:

## 2017-11-25 NOTE — Assessment & Plan Note (Signed)
Screened negative 

## 2017-11-25 NOTE — Assessment & Plan Note (Signed)
Lipid panel noted.  Elevated LDL.  He is seeing a new PCP, Dr. Parke SimmersBland today so will defer further lipid monitoring and management to her.

## 2018-04-06 ENCOUNTER — Other Ambulatory Visit: Payer: Self-pay | Admitting: Internal Medicine

## 2018-04-06 ENCOUNTER — Other Ambulatory Visit: Payer: Self-pay | Admitting: Behavioral Health

## 2018-05-14 ENCOUNTER — Other Ambulatory Visit: Payer: 59

## 2018-05-14 DIAGNOSIS — B20 Human immunodeficiency virus [HIV] disease: Secondary | ICD-10-CM

## 2018-05-15 LAB — T-HELPER CELL (CD4) - (RCID CLINIC ONLY)
CD4 % Helper T Cell: 37 % (ref 33–55)
CD4 T Cell Abs: 670 /uL (ref 400–2700)

## 2018-05-19 LAB — HIV-1 RNA QUANT-NO REFLEX-BLD
HIV 1 RNA QUANT: NOT DETECTED {copies}/mL
HIV-1 RNA QUANT, LOG: NOT DETECTED {Log_copies}/mL

## 2018-05-28 ENCOUNTER — Other Ambulatory Visit (HOSPITAL_COMMUNITY)
Admission: RE | Admit: 2018-05-28 | Discharge: 2018-05-28 | Disposition: A | Discharge status: Home / Self Care | Payer: 59 | Source: Ambulatory Visit | Attending: Internal Medicine | Admitting: Internal Medicine

## 2018-05-28 ENCOUNTER — Encounter: Payer: Self-pay | Admitting: Internal Medicine

## 2018-05-28 ENCOUNTER — Ambulatory Visit: Payer: 59 | Admitting: Internal Medicine

## 2018-05-28 VITALS — BP 130/76 | Temp 98.6°F | Wt 258.0 lb

## 2018-05-28 DIAGNOSIS — B20 Human immunodeficiency virus [HIV] disease: Secondary | ICD-10-CM | POA: Insufficient documentation

## 2018-05-28 DIAGNOSIS — Z23 Encounter for immunization: Secondary | ICD-10-CM

## 2018-05-28 DIAGNOSIS — Z79899 Other long term (current) drug therapy: Secondary | ICD-10-CM | POA: Insufficient documentation

## 2018-05-28 DIAGNOSIS — Z113 Encounter for screening for infections with a predominantly sexual mode of transmission: Secondary | ICD-10-CM

## 2018-05-28 DIAGNOSIS — Z7185 Encounter for immunization safety counseling: Secondary | ICD-10-CM

## 2018-05-28 DIAGNOSIS — Z7189 Other specified counseling: Secondary | ICD-10-CM

## 2018-05-28 NOTE — Assessment & Plan Note (Signed)
Doing well and no issues.  Prefers 6 month follow up.  rtc 6 months.

## 2018-05-28 NOTE — Assessment & Plan Note (Signed)
Discussed vaccines and Prevnar and flu shot given today

## 2018-05-28 NOTE — Assessment & Plan Note (Signed)
Will screen today 

## 2018-05-28 NOTE — Addendum Note (Signed)
Addended by: Gerarda Fraction on: 05/28/2018 09:53 AM   Modules accepted: Orders

## 2018-05-28 NOTE — Progress Notes (Signed)
   Subjective:    Patient ID: Jesse Frank, male    DOB: 18-Mar-1981, 37 y.o.   MRN: [id number]  HPI Here for HIV follow up.  Has been on Genvoya and denies anymissed doses.  CD4 of 690, viral load < 20.  No associated n/d.  No weight loss.  No new issues.    Review of Systems  Constitutional: Negative for appetite change.  Gastrointestinal: Negative for diarrhea.  Skin: Negative for rash.       Objective:   Physical Exam  Constitutional: He appears well-developed and well-nourished. No distress.  Eyes: No scleral icterus.  Cardiovascular: Normal rate, regular rhythm and normal heart sounds.  No murmur heard. Pulmonary/Chest: Breath sounds normal. No respiratory distress.  Lymphadenopathy:    He has no cervical adenopathy.  Skin: No rash noted.   SH: sexually active with condoms always       Assessment & Plan:

## 2018-05-29 LAB — URINE CYTOLOGY ANCILLARY ONLY
CHLAMYDIA, DNA PROBE: NEGATIVE
NEISSERIA GONORRHEA: NEGATIVE

## 2018-05-29 LAB — RPR: RPR: NONREACTIVE

## 2018-06-03 ENCOUNTER — Other Ambulatory Visit: Payer: Self-pay | Admitting: Behavioral Health

## 2018-06-03 DIAGNOSIS — B20 Human immunodeficiency virus [HIV] disease: Secondary | ICD-10-CM

## 2018-06-03 MED ORDER — ELVITEG-COBIC-EMTRICIT-TENOFAF 150-150-200-10 MG PO TABS: 1.0000 | ORAL_TABLET | Freq: Every day | ORAL | 5 refills | Status: DC

## 2018-11-12 ENCOUNTER — Other Ambulatory Visit: Payer: Self-pay

## 2018-11-12 ENCOUNTER — Other Ambulatory Visit (HOSPITAL_COMMUNITY)
Admission: RE | Admit: 2018-11-12 | Discharge: 2018-11-12 | Disposition: A | Discharge status: Home / Self Care | Payer: 59 | Source: Ambulatory Visit | Attending: Internal Medicine | Admitting: Internal Medicine

## 2018-11-12 ENCOUNTER — Other Ambulatory Visit: Payer: 59

## 2018-11-12 DIAGNOSIS — Z79899 Other long term (current) drug therapy: Secondary | ICD-10-CM

## 2018-11-12 DIAGNOSIS — Z113 Encounter for screening for infections with a predominantly sexual mode of transmission: Secondary | ICD-10-CM

## 2018-11-12 DIAGNOSIS — B20 Human immunodeficiency virus [HIV] disease: Secondary | ICD-10-CM

## 2018-11-13 LAB — T-HELPER CELL (CD4) - (RCID CLINIC ONLY)
CD4 T CELL ABS: 620 /uL (ref 400–2700)
CD4 T CELL HELPER: 40 % (ref 33–55)

## 2018-11-13 LAB — URINE CYTOLOGY ANCILLARY ONLY
Chlamydia: NEGATIVE
Neisseria Gonorrhea: NEGATIVE

## 2018-11-16 LAB — CBC WITH DIFFERENTIAL/PLATELET
Absolute Monocytes: 340 cells/uL (ref 200–950)
Basophils Absolute: 42 cells/uL (ref 0–200)
Basophils Relative: 1 %
Eosinophils Absolute: 50 cells/uL (ref 15–500)
Eosinophils Relative: 1.2 %
HCT: 46.7 % (ref 38.5–50.0)
Hemoglobin: 15.9 g/dL (ref 13.2–17.1)
Lymphs Abs: 1688 cells/uL (ref 850–3900)
MCH: 30 pg (ref 27.0–33.0)
MCHC: 34 g/dL (ref 32.0–36.0)
MCV: 88.1 fL (ref 80.0–100.0)
MPV: 10.4 fL (ref 7.5–12.5)
Monocytes Relative: 8.1 %
NEUTROS PCT: 49.5 %
Neutro Abs: 2079 cells/uL (ref 1500–7800)
Platelets: 270 10*3/uL (ref 140–400)
RBC: 5.3 10*6/uL (ref 4.20–5.80)
RDW: 13.6 % (ref 11.0–15.0)
Total Lymphocyte: 40.2 %
WBC: 4.2 10*3/uL (ref 3.8–10.8)

## 2018-11-16 LAB — LIPID PANEL
Cholesterol: 254 mg/dL — ABNORMAL HIGH (ref <200)
HDL: 40 mg/dL (ref >=40)
LDL Cholesterol (Calc): 189 mg/dL (calc) — ABNORMAL HIGH
Non-HDL Cholesterol (Calc): 214 mg/dL (calc) — ABNORMAL HIGH (ref <130)
Total CHOL/HDL Ratio: 6.4 (calc) — ABNORMAL HIGH (ref <5.0)
Triglycerides: 120 mg/dL (ref <150)

## 2018-11-16 LAB — COMPLETE METABOLIC PANEL WITH GFR
AG RATIO: 1.5 (calc) (ref 1.0–2.5)
ALT: 29 U/L (ref 9–46)
AST: 22 U/L (ref 10–40)
Albumin: 4.5 g/dL (ref 3.6–5.1)
Alkaline phosphatase (APISO): 42 U/L (ref 36–130)
BUN: 13 mg/dL (ref 7–25)
CO2: 27 mmol/L (ref 20–32)
Calcium: 10.1 mg/dL (ref 8.6–10.3)
Chloride: 106 mmol/L (ref 98–110)
Creat: 1.24 mg/dL (ref 0.60–1.35)
GFR, Est African American: 86 mL/min/{1.73_m2} (ref >=60)
GFR, Est Non African American: 74 mL/min/{1.73_m2} (ref >=60)
Globulin: 3 g/dL (calc) (ref 1.9–3.7)
Glucose, Bld: 95 mg/dL (ref 65–99)
Potassium: 4.4 mmol/L (ref 3.5–5.3)
Sodium: 141 mmol/L (ref 135–146)
Total Bilirubin: 0.4 mg/dL (ref 0.2–1.2)
Total Protein: 7.5 g/dL (ref 6.1–8.1)

## 2018-11-16 LAB — HIV-1 RNA QUANT-NO REFLEX-BLD
HIV 1 RNA QUANT: NOT DETECTED {copies}/mL
HIV-1 RNA Quant, Log: <1.3 Log copies/mL

## 2018-11-16 LAB — RPR: RPR: NONREACTIVE

## 2018-11-24 ENCOUNTER — Telehealth (INDEPENDENT_AMBULATORY_CARE_PROVIDER_SITE_OTHER): Payer: 59 | Admitting: Family

## 2018-11-24 DIAGNOSIS — Z0000 Encounter for general adult medical examination without abnormal findings: Secondary | ICD-10-CM | POA: Insufficient documentation

## 2018-11-24 DIAGNOSIS — B20 Human immunodeficiency virus [HIV] disease: Secondary | ICD-10-CM

## 2018-11-24 NOTE — Progress Notes (Signed)
Subjective:    Patient ID: Jesse Frank, male    DOB: 02/28/1981, 38 y.o.   MRN: [id number]  Chief Complaint:  HIV disease  Virtual Visit via Telephone Note  I connected withMichael Dinovo on 11/24/18 at  9:45 AM EDT by telephoneand verified that I am speaking with the correct person using two identifiers.  I discussed the limitations, risks, security and privacy concerns of performing an evaluation and management service by telephone and the availability of in person appointments. I also discussed with the patient that there may be a patient responsible charge related to this service. The patient expressed understanding and agreed to proceed.  HPI:  Mr. Ebron was last seen in the office on 05/28/2018 for routine follow-up with good adherence and tolerance to his ART regimen of Genvoya.  Viral load was noted to be undetectable with CD4 count of 670.  Most recent blood work completed on 11/12/2018 with viral load that remains undetectable with CD4 count of 620.  Kidney function, liver function, electrolytes within normal ranges.  Cholesterol with LDL level of 189, HDL of 40 and triglycerides of 120.  Healthcare maintenance due includes Menveo at next office visit  Mr. Kawecki has been taking his Genvoya as prescribed with no adverse side effects or missed doses.  Overall he feels very well.  He does express concern about being immunocompromised with coronovirus. Denies fevers, chills, night sweats, headaches, changes in vision, neck pain/stiffness, nausea, diarrhea, vomiting, lesions or rashes.  Mr. Blonski continues to work for [employer] of [place] and is contemplating working from home versus actually going to work at this time.  He remains covered through [employer] and receives his medication with out complication.  Requests a 90-day supply of medication if possible.  No Known Allergies    Outpatient Medications Prior to Visit  Medication Sig Dispense Refill  .  elvitegravir-cobicistat-emtricitabine-tenofovir (GENVOYA) 150-150-200-10 MG TABS tablet Take 1 tablet by mouth daily. 30 tablet 5   No facility-administered medications prior to visit.      Past Medical History:  Diagnosis Date  . HIV infection (HCC)      No past surgical history on file.   Review of Systems  Constitutional: Negative for appetite change, chills, fatigue, fever and unexpected weight change.  Eyes: Negative for visual disturbance.  Respiratory: Negative for cough, chest tightness, shortness of breath and wheezing.   Cardiovascular: Negative for chest pain and leg swelling.  Gastrointestinal: Negative for abdominal pain, constipation, diarrhea, nausea and vomiting.  Genitourinary: Negative for dysuria, flank pain, frequency, genital sores, hematuria and urgency.  Skin: Negative for rash.  Allergic/Immunologic: Negative for immunocompromised state.  Neurological: Negative for dizziness and headaches.      Objective:    There were no vitals taken for this visit. Nursing note and vital signs reviewed.  Physical Exam    Phone Visit Assessment & Plan:   Problem List Items Addressed This Visit      Other   HIV disease (HCC) - Primary    Mr. Digges has well-controlled HIV disease with good adherence and tolerance to his ART regimen of Genvoya.  He has no symptoms of opportunistic infection or progressive HIV disease at present and feeling well.  No problems obtaining medications and remains covered through [employer].  Plan to continue current dose of Genvoya.  Follow-up office visit in 6 months or sooner if needed with lab work 1 to 2 weeks prior to appointment.      Healthcare maintenance  Due for second dose of Menveo at next office visit.  Discussed importance of safe sexual practice to reduce risk of acquisition and transmission of STI.          I am having Reymon Flinchbaugh maintain his elvitegravir-cobicistat-emtricitabine-tenofovir.   I discussed the assessment and treatment plan with the patient. The patient was provided an opportunity to ask questions and all were answered. The patient agreed with the plan and demonstrated an understanding of the instructions.  The patient was advised to call back or seek an in-person evaluation if the symptoms worsen or if the condition fails to improve as anticipated.  I provided 15 minutes of non-face-to-face time during this encounter.  Marcos Eke, MSN, FNP-C Nurse Practitioner [facility] Office phone: [phone] Pager: [phone] [facility] Main number: [phone]

## 2018-11-24 NOTE — Assessment & Plan Note (Signed)
   Due for second dose of Menveo at next office visit.  Discussed importance of safe sexual practice to reduce risk of acquisition and transmission of STI.

## 2018-11-24 NOTE — Assessment & Plan Note (Signed)
Mr. Witteman has well-controlled HIV disease with good adherence and tolerance to his ART regimen of Genvoya.  He has no symptoms of opportunistic infection or progressive HIV disease at present and feeling well.  No problems obtaining medications and remains covered through [employer].  Plan to continue current dose of Genvoya.  Follow-up office visit in 6 months or sooner if needed with lab work 1 to 2 weeks prior to appointment.

## 2018-11-24 NOTE — Progress Notes (Deleted)
Patient ID: Jesse Frank, male   DOB: 1980-11-28, 38 y.o.   MRN: [id number]   Virtual Visit via Telephone Note  I connected with Jesse Frank on 11/24/18 at  9:45 AM EDT by telephone and verified that I am speaking with the correct person using two identifiers.   I discussed the limitations, risks, security and privacy concerns of performing an evaluation and management service by telephone and the availability of in person appointments. I also discussed with the patient that there may be a patient responsible charge related to this service. The patient expressed understanding and agreed to proceed.   History of Present Illness:  Mr. Jesse Frank was last seen in the office on 05/28/2018 for routine follow-up with good adherence and tolerance to his ART regimen of Genvoya.  Viral load was noted to be undetectable with CD4 count of 670.  Most recent blood work completed on 11/12/2018 with viral load that remains undetectable with CD4 count of 620.  Kidney function, liver function, electrolytes within normal ranges.  Cholesterol with LDL level of 189, HDL of 40 and triglycerides of 120.  Healthcare maintenance due includes Menveo at next office visit  Mr. Jesse Frank has been taking his Genvoya as prescribed with no adverse side effects or missed doses.  Overall he feels very well.  He does express concern about being immunocompromised with coronovirus. Denies fevers, chills, night sweats, headaches, changes in vision, neck pain/stiffness, nausea, diarrhea, vomiting, lesions or rashes.  Mr. Jesse Frank continues to work for [employer] [place] and is contemplating working from home versus actually going to work at this time.  He remains covered through [employer] and receives his medication with out complication.  Requests a 90-day supply of medication if possible.   Observations/Objective:   Assessment and Plan:   Follow Up Instructions:    I discussed the assessment and treatment plan with the patient. The  patient was provided an opportunity to ask questions and all were answered. The patient agreed with the plan and demonstrated an understanding of the instructions.   The patient was advised to call back or seek an in-person evaluation if the symptoms worsen or if the condition fails to improve as anticipated.  I provided *** minutes of non-face-to-face time during this encounter.   Jeanine Luz, FNP

## 2018-11-26 ENCOUNTER — Other Ambulatory Visit: Payer: Self-pay | Admitting: Internal Medicine

## 2018-11-26 DIAGNOSIS — B20 Human immunodeficiency virus [HIV] disease: Secondary | ICD-10-CM

## 2019-04-27 ENCOUNTER — Other Ambulatory Visit: Payer: Self-pay | Admitting: Internal Medicine

## 2019-04-27 DIAGNOSIS — B20 Human immunodeficiency virus [HIV] disease: Secondary | ICD-10-CM

## 2019-06-14 ENCOUNTER — Other Ambulatory Visit: Payer: Self-pay | Admitting: *Deleted

## 2019-06-14 ENCOUNTER — Other Ambulatory Visit: Payer: Self-pay

## 2019-06-14 DIAGNOSIS — B20 Human immunodeficiency virus [HIV] disease: Secondary | ICD-10-CM

## 2019-06-15 ENCOUNTER — Other Ambulatory Visit: Payer: Self-pay

## 2019-06-15 ENCOUNTER — Other Ambulatory Visit: Payer: 59

## 2019-06-15 DIAGNOSIS — B20 Human immunodeficiency virus [HIV] disease: Secondary | ICD-10-CM

## 2019-06-16 LAB — T-HELPER CELL (CD4) - (RCID CLINIC ONLY)
CD4 % Helper T Cell: 42 % (ref 33–65)
CD4 T Cell Abs: 650 /uL (ref 400–1790)

## 2019-06-17 LAB — COMPLETE METABOLIC PANEL WITH GFR
AG Ratio: 1.4 (calc) (ref 1.0–2.5)
ALT: 25 U/L (ref 9–46)
AST: 21 U/L (ref 10–40)
Albumin: 4.2 g/dL (ref 3.6–5.1)
Alkaline phosphatase (APISO): 46 U/L (ref 36–130)
BUN: 8 mg/dL (ref 7–25)
CO2: 29 mmol/L (ref 20–32)
Calcium: 9.6 mg/dL (ref 8.6–10.3)
Chloride: 106 mmol/L (ref 98–110)
Creat: 1.16 mg/dL (ref 0.60–1.35)
GFR, Est African American: 92 mL/min/{1.73_m2} (ref >=60)
GFR, Est Non African American: 79 mL/min/{1.73_m2} (ref >=60)
Globulin: 3.1 g/dL (calc) (ref 1.9–3.7)
Glucose, Bld: 104 mg/dL — ABNORMAL HIGH (ref 65–99)
Potassium: 4.3 mmol/L (ref 3.5–5.3)
Sodium: 139 mmol/L (ref 135–146)
Total Bilirubin: 0.5 mg/dL (ref 0.2–1.2)
Total Protein: 7.3 g/dL (ref 6.1–8.1)

## 2019-06-17 LAB — CBC WITH DIFFERENTIAL/PLATELET
Absolute Monocytes: 459 cells/uL (ref 200–950)
Basophils Absolute: 41 cells/uL (ref 0–200)
Basophils Relative: 0.8 %
Eosinophils Absolute: 102 cells/uL (ref 15–500)
Eosinophils Relative: 2 %
HCT: 46.3 % (ref 38.5–50.0)
Hemoglobin: 15.5 g/dL (ref 13.2–17.1)
Lymphs Abs: 1663 cells/uL (ref 850–3900)
MCH: 29.9 pg (ref 27.0–33.0)
MCHC: 33.5 g/dL (ref 32.0–36.0)
MCV: 89.4 fL (ref 80.0–100.0)
MPV: 9.9 fL (ref 7.5–12.5)
Monocytes Relative: 9 %
Neutro Abs: 2836 cells/uL (ref 1500–7800)
Neutrophils Relative %: 55.6 %
Platelets: 257 10*3/uL (ref 140–400)
RBC: 5.18 10*6/uL (ref 4.20–5.80)
RDW: 13 % (ref 11.0–15.0)
Total Lymphocyte: 32.6 %
WBC: 5.1 10*3/uL (ref 3.8–10.8)

## 2019-06-17 LAB — HIV-1 RNA QUANT-NO REFLEX-BLD
HIV 1 RNA Quant: <20 copies/mL
HIV-1 RNA Quant, Log: <1.3 Log copies/mL

## 2019-06-30 ENCOUNTER — Ambulatory Visit (INDEPENDENT_AMBULATORY_CARE_PROVIDER_SITE_OTHER): Payer: 59 | Admitting: Internal Medicine

## 2019-06-30 ENCOUNTER — Encounter: Payer: Self-pay | Admitting: Internal Medicine

## 2019-06-30 ENCOUNTER — Other Ambulatory Visit: Payer: Self-pay

## 2019-06-30 VITALS — Ht 71.0 in | Wt 262.0 lb

## 2019-06-30 DIAGNOSIS — Z79899 Other long term (current) drug therapy: Secondary | ICD-10-CM

## 2019-06-30 DIAGNOSIS — Z5181 Encounter for therapeutic drug level monitoring: Secondary | ICD-10-CM | POA: Diagnosis not present

## 2019-06-30 DIAGNOSIS — Z113 Encounter for screening for infections with a predominantly sexual mode of transmission: Secondary | ICD-10-CM

## 2019-06-30 DIAGNOSIS — B20 Human immunodeficiency virus [HIV] disease: Secondary | ICD-10-CM

## 2019-06-30 DIAGNOSIS — Z23 Encounter for immunization: Secondary | ICD-10-CM

## 2019-06-30 MED ORDER — GENVOYA 150-150-200-10 MG PO TABS: 1.0000 | ORAL_TABLET | Freq: Every day | ORAL | 11 refills | Status: DC

## 2019-06-30 NOTE — Assessment & Plan Note (Addendum)
He is doing very well with this, no issues and will continue on Genvoya.   Flu shot today rtc 6 months.

## 2019-06-30 NOTE — Assessment & Plan Note (Signed)
Discussed Menveo and #2 given today 

## 2019-06-30 NOTE — Assessment & Plan Note (Signed)
Creat, LFTs ok.  Glucose slightly up but was non-fasting.

## 2019-06-30 NOTE — Progress Notes (Signed)
   Subjective:    Patient ID: Olyn Landstrom, male    DOB: [birth year], 38 y.o.   MRN: [id number]  HPI Here for follow up of HIV He is on Genvoya and denies any missed doses.  CD4 650 and viral load < 20.  Creat wnl.  No new issues.  No associated rash or diarrhea.  Due for Menveo #2   He is working on his PhD now in Engineer, materials.     Review of Systems  Constitutional: Negative for unexpected weight change.  Gastrointestinal: Negative for diarrhea and nausea.  Skin: Negative for rash.       Objective:   Physical Exam Constitutional:      Appearance: Normal appearance.  Eyes:     General: No scleral icterus. Cardiovascular:     Rate and Rhythm: Normal rate and regular rhythm.     Heart sounds: No murmur.  Pulmonary:     Effort: Pulmonary effort is normal.     Breath sounds: Normal breath sounds.  Skin:    Findings: No rash.  Neurological:     Mental Status: He is alert.  Psychiatric:        Mood and Affect: Mood normal.   SH: no tobacco        Assessment & Plan:

## 2019-12-15 ENCOUNTER — Other Ambulatory Visit: Payer: 59

## 2019-12-15 ENCOUNTER — Other Ambulatory Visit (HOSPITAL_COMMUNITY)
Admission: RE | Admit: 2019-12-15 | Discharge: 2019-12-15 | Disposition: A | Discharge status: Home / Self Care | Payer: 59 | Source: Ambulatory Visit | Attending: Internal Medicine | Admitting: Internal Medicine

## 2019-12-15 ENCOUNTER — Other Ambulatory Visit: Payer: Self-pay

## 2019-12-15 DIAGNOSIS — Z113 Encounter for screening for infections with a predominantly sexual mode of transmission: Secondary | ICD-10-CM

## 2019-12-15 DIAGNOSIS — B20 Human immunodeficiency virus [HIV] disease: Secondary | ICD-10-CM

## 2019-12-15 DIAGNOSIS — Z79899 Other long term (current) drug therapy: Secondary | ICD-10-CM

## 2019-12-16 LAB — URINE CYTOLOGY ANCILLARY ONLY
Chlamydia: NEGATIVE
Comment: NEGATIVE
Comment: NORMAL
Neisseria Gonorrhea: NEGATIVE

## 2019-12-16 LAB — T-HELPER CELL (CD4) - (RCID CLINIC ONLY)
CD4 % Helper T Cell: 40 % (ref 33–65)
CD4 T Cell Abs: 768 /uL (ref 400–1790)

## 2019-12-17 LAB — LIPID PANEL
Cholesterol: 269 mg/dL — ABNORMAL HIGH
HDL: 38 mg/dL — ABNORMAL LOW
LDL Cholesterol (Calc): 195 mg/dL — ABNORMAL HIGH
Non-HDL Cholesterol (Calc): 231 mg/dL — ABNORMAL HIGH
Total CHOL/HDL Ratio: 7.1 (calc) — ABNORMAL HIGH
Triglycerides: 188 mg/dL — ABNORMAL HIGH

## 2019-12-17 LAB — HIV-1 RNA QUANT-NO REFLEX-BLD
HIV 1 RNA Quant: <20 {copies}/mL
HIV-1 RNA Quant, Log: <1.3 {Log_copies}/mL

## 2019-12-17 LAB — SYPHILIS: RPR W/REFLEX TO RPR TITER AND TREPONEMAL ANTIBODIES, TRADITIONAL SCREENING AND DIAGNOSIS ALGORITHM: RPR Ser Ql: NONREACTIVE

## 2019-12-28 ENCOUNTER — Telehealth: Payer: Self-pay

## 2019-12-28 NOTE — Telephone Encounter (Signed)
COVID-19 Pre-Screening Questions:12/28/19  Do you currently have a fever (>100 F), chills or unexplained body aches? NO   Are you currently experiencing new cough, shortness of breath, sore throat, runny nose? NO .  Have you recently travelled outside the state of [place] in the last 14 days? NO .  Have you been in contact with someone that is currently pending confirmation of Covid19 testing or has been confirmed to have the Covid19 virus?  NO  **If the patient answers NO to ALL questions -  advise the patient to please call the clinic before coming to the office should any symptoms develop.     

## 2019-12-29 ENCOUNTER — Ambulatory Visit: Payer: 59 | Admitting: Internal Medicine

## 2019-12-29 ENCOUNTER — Encounter: Payer: Self-pay | Admitting: Internal Medicine

## 2019-12-29 ENCOUNTER — Other Ambulatory Visit: Payer: Self-pay

## 2019-12-29 VITALS — BP 136/83 | HR 76 | Temp 98.0°F | Ht 67.0 in | Wt 270.0 lb

## 2019-12-29 DIAGNOSIS — Z79899 Other long term (current) drug therapy: Secondary | ICD-10-CM

## 2019-12-29 DIAGNOSIS — B20 Human immunodeficiency virus [HIV] disease: Secondary | ICD-10-CM

## 2019-12-29 NOTE — Assessment & Plan Note (Signed)
Lipid panel noted and elevated.  He is unsure if he was fasting.  Will work on diet modification.  Will follow in 1 year.

## 2019-12-29 NOTE — Assessment & Plan Note (Signed)
Doing well and no issues with medication.   rtc 6 months.

## 2019-12-29 NOTE — Progress Notes (Signed)
   Subjective:    Patient ID: Jesse Frank, male    DOB: Jun 22, 1981, 39 y.o.   MRN: [id number]  HPI Here for follow up of HIV CD4 of 768, viral load < 20.   No new issues.  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     Review of Systems  Constitutional: Negative for fatigue.  Gastrointestinal: Negative for diarrhea and nausea.  Skin: Negative for rash.       Objective:   Physical Exam Constitutional:      Appearance: Normal appearance.  Eyes:     General: No scleral icterus. Cardiovascular:     Rate and Rhythm: Normal rate and regular rhythm.  Neurological:     General: No focal deficit present.  Psychiatric:        Mood and Affect: Mood normal.           Assessment & Plan:

## 2020-01-20 ENCOUNTER — Other Ambulatory Visit: Payer: Self-pay | Admitting: Internal Medicine

## 2020-01-20 DIAGNOSIS — B20 Human immunodeficiency virus [HIV] disease: Secondary | ICD-10-CM

## 2020-05-29 ENCOUNTER — Other Ambulatory Visit: Payer: Self-pay

## 2020-05-29 ENCOUNTER — Other Ambulatory Visit: Payer: 59

## 2020-05-29 DIAGNOSIS — B20 Human immunodeficiency virus [HIV] disease: Secondary | ICD-10-CM

## 2020-05-30 LAB — T-HELPER CELL (CD4) - (RCID CLINIC ONLY)
CD4 % Helper T Cell: 40 % (ref 33–65)
CD4 T Cell Abs: 945 /uL (ref 400–1790)

## 2020-06-01 LAB — COMPLETE METABOLIC PANEL WITH GFR
AG Ratio: 1.5 (calc) (ref 1.0–2.5)
ALT: 28 U/L (ref 9–46)
AST: 19 U/L (ref 10–40)
Albumin: 4.1 g/dL (ref 3.6–5.1)
Alkaline phosphatase (APISO): 38 U/L (ref 36–130)
BUN: 12 mg/dL (ref 7–25)
CO2: 28 mmol/L (ref 20–32)
Calcium: 9.3 mg/dL (ref 8.6–10.3)
Chloride: 104 mmol/L (ref 98–110)
Creat: 1.15 mg/dL (ref 0.60–1.35)
GFR, Est African American: 93 mL/min/{1.73_m2} (ref >=60)
GFR, Est Non African American: 80 mL/min/{1.73_m2} (ref >=60)
Globulin: 2.8 g/dL (calc) (ref 1.9–3.7)
Glucose, Bld: 97 mg/dL (ref 65–99)
Potassium: 3.9 mmol/L (ref 3.5–5.3)
Sodium: 140 mmol/L (ref 135–146)
Total Bilirubin: 0.5 mg/dL (ref 0.2–1.2)
Total Protein: 6.9 g/dL (ref 6.1–8.1)

## 2020-06-01 LAB — CBC WITH DIFFERENTIAL/PLATELET
Absolute Monocytes: 458 cells/uL (ref 200–950)
Basophils Absolute: 43 cells/uL (ref 0–200)
Basophils Relative: 0.7 %
Eosinophils Absolute: 159 cells/uL (ref 15–500)
Eosinophils Relative: 2.6 %
HCT: 46.4 % (ref 38.5–50.0)
Hemoglobin: 15.7 g/dL (ref 13.2–17.1)
Lymphs Abs: 2806 cells/uL (ref 850–3900)
MCH: 29.8 pg (ref 27.0–33.0)
MCHC: 33.8 g/dL (ref 32.0–36.0)
MCV: 88.2 fL (ref 80.0–100.0)
MPV: 10 fL (ref 7.5–12.5)
Monocytes Relative: 7.5 %
Neutro Abs: 2635 cells/uL (ref 1500–7800)
Neutrophils Relative %: 43.2 %
Platelets: 261 10*3/uL (ref 140–400)
RBC: 5.26 10*6/uL (ref 4.20–5.80)
RDW: 13.3 % (ref 11.0–15.0)
Total Lymphocyte: 46 %
WBC: 6.1 10*3/uL (ref 3.8–10.8)

## 2020-06-01 LAB — HIV-1 RNA QUANT-NO REFLEX-BLD
HIV 1 RNA Quant: <20 Copies/mL
HIV-1 RNA Quant, Log: <1.3 Log cps/mL

## 2020-06-12 ENCOUNTER — Ambulatory Visit: Payer: 59 | Admitting: Internal Medicine

## 2020-06-12 ENCOUNTER — Encounter: Payer: Self-pay | Admitting: Internal Medicine

## 2020-06-12 ENCOUNTER — Other Ambulatory Visit: Payer: Self-pay

## 2020-06-12 ENCOUNTER — Other Ambulatory Visit (HOSPITAL_COMMUNITY)
Admission: RE | Admit: 2020-06-12 | Discharge: 2020-06-12 | Disposition: A | Discharge status: Home / Self Care | Payer: 59 | Source: Ambulatory Visit | Attending: Internal Medicine | Admitting: Internal Medicine

## 2020-06-12 VITALS — BP 144/84 | HR 90 | Temp 98.9°F | Ht 67.0 in | Wt 274.0 lb

## 2020-06-12 DIAGNOSIS — Z113 Encounter for screening for infections with a predominantly sexual mode of transmission: Secondary | ICD-10-CM | POA: Insufficient documentation

## 2020-06-12 DIAGNOSIS — F410 Panic disorder [episodic paroxysmal anxiety] without agoraphobia: Secondary | ICD-10-CM | POA: Diagnosis not present

## 2020-06-12 DIAGNOSIS — B20 Human immunodeficiency virus [HIV] disease: Secondary | ICD-10-CM | POA: Diagnosis not present

## 2020-06-12 DIAGNOSIS — Z79899 Other long term (current) drug therapy: Secondary | ICD-10-CM

## 2020-06-12 DIAGNOSIS — Z202 Contact with and (suspected) exposure to infections with a predominantly sexual mode of transmission: Secondary | ICD-10-CM

## 2020-06-12 NOTE — Assessment & Plan Note (Signed)
Will screen today 

## 2020-06-12 NOTE — Assessment & Plan Note (Signed)
Doing well with this, no concners and he can rtc in 6 months.

## 2020-06-12 NOTE — Progress Notes (Signed)
   Subjective:    Patient ID: Jesse Frank, male    DOB: Jan 17, 1981, 39 y.o.   MRN: [id number]  HPI Here for follow up of HIV CD4 of 945, viral load < 20.   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  New problem is he is having new panic attacks.  Seems to be related more to going back to work and others around him going back to work.  Many have been sick with COVID.  He has been vaccinated.  He is anxious about going back, currently on leave.     Review of Systems  Constitutional: Negative for fatigue.  Gastrointestinal: Negative for diarrhea and nausea.  Skin: Negative for rash.  Neurological: Negative for dizziness.  Psychiatric/Behavioral: The patient is nervous/anxious.        Objective:   Physical Exam Constitutional:      Appearance: Normal appearance.  Eyes:     General: No scleral icterus. Cardiovascular:     Rate and Rhythm: Normal rate and regular rhythm.  Neurological:     General: No focal deficit present.  Psychiatric:        Mood and Affect: Mood normal.        Behavior: Behavior normal.        Thought Content: Thought content normal.        Judgment: Judgment normal.   SH: no tobacco        Assessment & Plan:

## 2020-06-12 NOTE — Assessment & Plan Note (Signed)
This is a new issue for him.  Seems to be related to COVID stress.  Interferring with his PhD and work.  He is scheduled with our counselor to explore further.

## 2020-06-13 ENCOUNTER — Ambulatory Visit: Payer: 59

## 2020-06-13 LAB — CYTOLOGY, (ORAL, ANAL, URETHRAL) ANCILLARY ONLY
Chlamydia: NEGATIVE
Chlamydia: NEGATIVE
Comment: NEGATIVE
Comment: NEGATIVE
Comment: NORMAL
Comment: NORMAL
Neisseria Gonorrhea: NEGATIVE
Neisseria Gonorrhea: NEGATIVE

## 2020-06-13 LAB — URINE CYTOLOGY ANCILLARY ONLY
Chlamydia: NEGATIVE
Comment: NEGATIVE
Comment: NORMAL
Neisseria Gonorrhea: NEGATIVE

## 2020-06-15 ENCOUNTER — Telehealth: Payer: Self-pay

## 2020-06-15 NOTE — Telephone Encounter (Signed)
Received fax form from [employer] [place] for Disability Benefits through [employer]. Form is requesting last office notes and completed provider statement. Will make copy of form and leave in provider's box. Copy will be placed in accordion folder. Lorenso Courier, [place]

## 2020-06-20 ENCOUNTER — Other Ambulatory Visit: Payer: Self-pay

## 2020-06-20 ENCOUNTER — Ambulatory Visit: Payer: 59

## 2020-06-22 ENCOUNTER — Telehealth: Payer: Self-pay

## 2020-06-22 NOTE — Telephone Encounter (Signed)
Called patient asking if they wanted a copy of disability forms before faxing over to [employer] at [phone]. Patient wanted a copy, so a copy was made, and place up front which the patient said they will retrieve on 06/27/2020. The forms were then faxed over to [facility] at 11:47 on 06/22/2020. Crissie Figures

## 2020-06-22 NOTE — Telephone Encounter (Signed)
Thanks.  I filled it out the best I can with the knowledge I have and he will need to see psychiatry going forward per the company.

## 2020-06-26 ENCOUNTER — Other Ambulatory Visit: Payer: Self-pay | Admitting: Internal Medicine

## 2020-06-26 DIAGNOSIS — B20 Human immunodeficiency virus [HIV] disease: Secondary | ICD-10-CM

## 2020-06-27 ENCOUNTER — Other Ambulatory Visit: Payer: Self-pay

## 2020-06-27 ENCOUNTER — Ambulatory Visit: Payer: 59

## 2020-06-27 DIAGNOSIS — F4322 Adjustment disorder with anxiety: Secondary | ICD-10-CM

## 2020-07-04 ENCOUNTER — Other Ambulatory Visit: Payer: Self-pay

## 2020-07-04 ENCOUNTER — Ambulatory Visit: Payer: 59

## 2020-07-11 ENCOUNTER — Other Ambulatory Visit: Payer: Self-pay

## 2020-07-11 ENCOUNTER — Ambulatory Visit: Payer: 59

## 2020-07-11 DIAGNOSIS — F410 Panic disorder [episodic paroxysmal anxiety] without agoraphobia: Secondary | ICD-10-CM

## 2020-07-18 ENCOUNTER — Other Ambulatory Visit: Payer: Self-pay

## 2020-07-18 ENCOUNTER — Ambulatory Visit: Payer: 59

## 2020-07-19 ENCOUNTER — Telehealth: Payer: Self-pay | Admitting: *Deleted

## 2020-07-19 DIAGNOSIS — F410 Panic disorder [episodic paroxysmal anxiety] without agoraphobia: Secondary | ICD-10-CM

## 2020-07-19 NOTE — Telephone Encounter (Signed)
Received recommendation from Marylu Lund, counselor at [facility], to refer patient to Ellis Savage at [facility].  Please cosign. Andree Coss, RN

## 2020-07-20 NOTE — Progress Notes (Incomplete)
Mental Health Therapist Progress Note   Name: Jesse Frank  Total time: 60  Type of Service: Individual Outpatient Mental Health Therapy  OBJECTIVE:  Mood: Euthymic and Affect: Appropriate Risk of harm to self or others: No plan to harm self or others  DIAGNOSIS:   GOALS ADDRESSED:  Patient will: 1.  Reduce symptoms of: anxiety  2.  Increase knowledge and/or ability of: coping skills  3.  Demonstrate ability to: Increase healthy adjustment to current life circumstances  INTERVENTIONS: Interventions utilized:  Supportive Counseling Therapist met with patient for outpatient mental health individual therapy to include ongoing assessment, support, and reinforcement.  Therapist allowed patient to "check in" since previous session; asking patient to share any positive coping skills they may have used over the previous week, along with any challenges faced.  Therapist provided supportive listening as patient processed their thoughts, emotional responses, and behaviors surrounding several stressors.  EFFECTIVENESS/PLAN: Client was alert, oriented x3, with no SI, HI, or symptoms of psychosis (risk low).  Client was pleasant and friendly, engaging openly and appropriately with therapist, benefiting from supportive listening and exploration of feelings.  Next session recommended in one to two weeks.   Hughes Better, LCSW

## 2020-07-20 NOTE — Progress Notes (Signed)
Mental Health Therapist Progress Note   Name: Jaliel Deavers  Total time: 60  Type of Service: Individual Outpatient Mental Health Therapy  OBJECTIVE:  Mood: Euthymic and Affect: Appropriate Risk of harm to self or others: No plan to harm self or others  DIAGNOSIS:   GOALS ADDRESSED:  Patient will: 1.  Reduce symptoms of: anxiety  2.  Increase knowledge and/or ability of: coping skills  3.  Demonstrate ability to: Increase healthy adjustment to current life circumstances  INTERVENTIONS: Interventions utilized:  Supportive Counseling Individual session with Counseling intern at 3:00pm. Client is 39 years old and identifies as an African American cis male. Counselor and client discussed client's childhood experiences and how they impact him as an adult.  Client was oriented 3x (location, time, place). Client's mood was content, and affect was congruent. Client engaged openly and appropriately when discussing details about his familial background. Client benefited from counselor creating an open space to discuss his family dynamics.  Counselor began session with a check-in. Client shared details about a panic attack he experienced over the weekend. Counselor utilized open-ended questions and empathy to help client process this experience. When asked if he believes it was connected to his COVID concerns, client acknowledged some connection and stated it may also be due to him being more stressed than usual. Counselor and client then transitioned to discussing the concept of stress. Client shared that he has always dealt with underlying stress. Counselor provided psychoeducation around the generational stress cycle that primarily impacts Black families. Client agreed and stated that he has always felt like he was in survival mode. Counselor utilized Product/process development scientist and feelings to validate and explore MJ's experience. Ct. and counselor used the rest of the session to explore his family  dynamics and his role within the dynamic.  Counselor will continue to meet weekly.     Discussed survival mode mentality  Relationship with great grandmother   Birdie Riddle, LCSW

## 2020-07-20 NOTE — Progress Notes (Signed)
Mental Health Therapist Progress Note   Name: Jesse Frank  Total time: 60  Type of Service: Individual Outpatient Mental Health Therapy Assessment  OBJECTIVE:  Mood: Anxious and Affect: Appropriate Risk of harm to self or others: No plan to harm self or others  DIAGNOSIS: (F43.22) Adjustment Disorder with Anxiety  GOALS ADDRESSED:  Patient will: 1.  Reduce symptoms of: anxiety  2.  Increase knowledge and/or ability of: coping skills  3.  Demonstrate ability to: Increase healthy adjustment to current life circumstances   Therapist met with client for the purpose of completing a comprehensive clinical assessment. The CCA is based on self-report from the individual, and will consider available information from family, acquaintances, and other professionals. Though efforts are made to encourage full and accurate disclosure by those involved, such disclosure is not assured.  If conflicting evidence becomes available, additional contact can be made with the individual and results/recommendations can be appropriately amended. Client is a 39 year old, single, African American male, referred by his medical provider for CCA and treatment.  Client is seeking counseling to address symptoms of anxiety related to recent work environment stressors. 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  Client shared that of those 6 employees, one passed away; client shared that he had a close relationship with this co-worker and was having difficulty coping with her loss.  Client reported that he has been struggling with the following symptoms on most days: the presence of excessive anxiety and worry about contracting the coronavirus, edginess or restlessness, tiring easily;  more fatigued than usual, impaired concentration, irritability, and difficulty with sleep. Client reports that he is experience "panic attacks" to include increased heart rate, trembling/shaking, sweating, shortness of breath, and tightness in his throat.  During these episodes, client reports that he feels a sense of impending doom and intense feelings of fear.    Client reports that he has not had any prior therapy and is motivated to improve his coping skills related to his current feelings and stressful life experiences. Client denies any history of substance use concerns and reports that he is overall in good physical health. Client is recommended to attend counseling up to 1x weekly and follow recommendations of all providers. Next session recommended in one week.   Mental Status Exam: Client was alert, oriented x4, with no reported current SI, HI, or current symptoms of psychosis (risk low).  Affect was congruent. Speech was appropriate in tone and content. No observed memory impairments. Client engaged openly and appropriately with clinician.     Hughes Better, LCSW

## 2020-07-20 NOTE — Progress Notes (Unsigned)
Mental Health Therapist Progress Note   Name: Jesse Frank  Total time: 60  Type of Service: Individual Outpatient Mental Health Therapy  OBJECTIVE:  Mood: Euthymic and Affect: Appropriate Risk of harm to self or others: No plan to harm self or others  DIAGNOSIS:   GOALS ADDRESSED:  Patient will: 1.  Reduce symptoms of: anxiety  2.  Increase knowledge and/or ability of: coping skills  3.  Demonstrate ability to: Increase healthy adjustment to current life circumstances  INTERVENTIONS: Interventions utilized:  Brief CBT and Supportive Counseling Therapist met with patient for outpatient mental health individual therapy to include ongoing assessment, support, and reinforcement.  Therapist allowed patient to "check in" since previous session; asking patient to share any positive coping skills they may have used over the previous week, along with any challenges faced.  Therapist provided supportive listening as patient processed their thoughts, emotional responses, and behaviors surrounding several stressors.  EFFECTIVENESS/PLAN: Client was alert, oriented x3, with no SI, HI, or symptoms of psychosis (risk low).  Client was pleasant and friendly, engaging openly and appropriately with therapist, benefiting from supportive listening and exploration of feelings.  Next session recommended in one to two weeks.   Jesse Better, LCSW

## 2020-07-25 ENCOUNTER — Other Ambulatory Visit: Payer: Self-pay

## 2020-07-25 ENCOUNTER — Ambulatory Visit: Payer: 59

## 2020-08-01 ENCOUNTER — Ambulatory Visit: Payer: 59

## 2020-08-01 ENCOUNTER — Other Ambulatory Visit: Payer: Self-pay

## 2020-08-01 NOTE — Progress Notes (Incomplete)
Mental Health Therapist Progress Note   Name: Jesse Frank  Total time:30 mins  Type of Service: Individual Outpatient Mental Health Therapy  OBJECTIVE:  Mood: Euthymic and Affect: Appropriate Risk of harm to self or others: No plan to harm self or others  DIAGNOSIS: Adjustment Disorder with A  GOALS ADDRESSED:  Patient will: 1.  Reduce symptoms of: {IBH Symptoms:21014056}  2.  Increase knowledge and/or ability of: {IBH Patient Tools:21014057}  3.  Demonstrate ability to: {IBH Goals:21014053}  INTERVENTIONS: Interventions utilized:  {IBH Interventions:21014054} Therapist met with patient for outpatient mental health individual therapy to include ongoing assessment, support, and reinforcement.  Therapist allowed patient to "check in" since previous session; asking patient to share any positive coping skills they may have used over the previous week, along with any challenges faced.  Therapist provided supportive listening as patient processed their thoughts, emotional responses, and behaviors surrounding several stressors.  EFFECTIVENESS/PLAN: Client was alert, oriented x3, with no SI, HI, or symptoms of psychosis (risk low).  Client was pleasant and friendly, engaging openly and appropriately with therapist, benefiting from supportive listening and exploration of feelings.  Next session recommended in one to two weeks.   Hughes Better, LCSW

## 2020-08-08 ENCOUNTER — Other Ambulatory Visit: Payer: Self-pay

## 2020-08-08 ENCOUNTER — Ambulatory Visit: Payer: 59

## 2020-08-15 ENCOUNTER — Other Ambulatory Visit: Payer: Self-pay

## 2020-08-15 ENCOUNTER — Ambulatory Visit: Payer: 59

## 2020-08-21 ENCOUNTER — Telehealth: Payer: Self-pay

## 2020-08-21 NOTE — Telephone Encounter (Signed)
That should be for psychiatry since he is not disabled from my standpoint. thanks

## 2020-08-21 NOTE — Telephone Encounter (Signed)
Received call from the office of Norman Clay requesting to conduct a peer to peer with Dr. Luciana Axe in regards to the patient's disability claim. They state that the insurance company [employer]) is requesting the review. Will route to provider.   Call back number is [phone]  Sandie Ano, RN

## 2020-08-22 ENCOUNTER — Ambulatory Visit: Payer: 59

## 2020-08-22 NOTE — Telephone Encounter (Signed)
Received call from Dr. Verita Lamb office requesting peer to peer. Called office back and provided them with [facility] and Marylu Lund Cecil's contact information per Dr. Ephriam Knuckles instruction.  Kalli Greenfield [facility] Mast, RN

## 2020-08-24 ENCOUNTER — Ambulatory Visit: Payer: 59

## 2020-08-29 ENCOUNTER — Ambulatory Visit: Payer: 59

## 2020-08-31 ENCOUNTER — Ambulatory Visit: Payer: 59

## 2020-09-05 ENCOUNTER — Ambulatory Visit: Payer: 59

## 2020-09-07 ENCOUNTER — Other Ambulatory Visit: Payer: Self-pay

## 2020-09-07 ENCOUNTER — Ambulatory Visit: Payer: 59

## 2020-09-21 ENCOUNTER — Ambulatory Visit: Payer: 59

## 2020-11-27 ENCOUNTER — Other Ambulatory Visit: Payer: 59

## 2020-11-27 ENCOUNTER — Other Ambulatory Visit: Payer: Self-pay

## 2020-11-27 DIAGNOSIS — Z113 Encounter for screening for infections with a predominantly sexual mode of transmission: Secondary | ICD-10-CM

## 2020-11-27 DIAGNOSIS — Z79899 Other long term (current) drug therapy: Secondary | ICD-10-CM

## 2020-11-27 DIAGNOSIS — B20 Human immunodeficiency virus [HIV] disease: Secondary | ICD-10-CM

## 2020-11-28 LAB — T-HELPER CELL (CD4) - (RCID CLINIC ONLY)
CD4 % Helper T Cell: 41 % (ref 33–65)
CD4 T Cell Abs: 799 /uL (ref 400–1790)

## 2020-11-29 LAB — RPR: RPR Ser Ql: NONREACTIVE

## 2020-11-29 LAB — LIPID PANEL
Cholesterol: 196 mg/dL (ref <200)
HDL: 40 mg/dL (ref >=40)
LDL Cholesterol (Calc): 132 mg/dL (calc) — ABNORMAL HIGH
Non-HDL Cholesterol (Calc): 156 mg/dL (calc) — ABNORMAL HIGH (ref <130)
Total CHOL/HDL Ratio: 4.9 (calc) (ref <5.0)
Triglycerides: 129 mg/dL (ref <150)

## 2020-11-29 LAB — HIV-1 RNA QUANT-NO REFLEX-BLD
HIV 1 RNA Quant: <20 Copies/mL — ABNORMAL HIGH
HIV-1 RNA Quant, Log: <1.3 Log cps/mL — ABNORMAL HIGH

## 2020-12-11 ENCOUNTER — Encounter: Payer: Self-pay | Admitting: Internal Medicine

## 2020-12-11 ENCOUNTER — Other Ambulatory Visit: Payer: Self-pay

## 2020-12-11 ENCOUNTER — Ambulatory Visit (INDEPENDENT_AMBULATORY_CARE_PROVIDER_SITE_OTHER): Payer: 59 | Admitting: Internal Medicine

## 2020-12-11 VITALS — BP 151/84 | HR 79 | Temp 98.0°F | Ht 69.0 in | Wt 255.0 lb

## 2020-12-11 DIAGNOSIS — Z79899 Other long term (current) drug therapy: Secondary | ICD-10-CM | POA: Diagnosis not present

## 2020-12-11 DIAGNOSIS — B20 Human immunodeficiency virus [HIV] disease: Secondary | ICD-10-CM | POA: Diagnosis not present

## 2020-12-11 DIAGNOSIS — F410 Panic disorder [episodic paroxysmal anxiety] without agoraphobia: Secondary | ICD-10-CM

## 2020-12-11 DIAGNOSIS — Z113 Encounter for screening for infections with a predominantly sexual mode of transmission: Secondary | ICD-10-CM

## 2020-12-11 DIAGNOSIS — E6609 Other obesity due to excess calories: Secondary | ICD-10-CM | POA: Diagnosis not present

## 2020-12-11 NOTE — Progress Notes (Signed)
   Subjective:    Patient ID: Jesse Frank, male    DOB: 01/16/1981, 40 y.o.   MRN: [id number]  HPI Here for follow up of HIV He is doing well, had missed a couple of doses since his last visit due to his ongoing anxiety and back on track without issues.  CD4 of 799, viral load < 20.  He has been seeing our counselor and pleased with that, has changed his job which was the source of his anxiety and in a much better place now.  Now is vegan, taking vitamin B12, D and seaweed.  Starting to loose weight.  Working on getting a PCP.     Review of Systems  Constitutional: Negative for fatigue.  Gastrointestinal: Negative for diarrhea and nausea.  Skin: Negative for rash.       Objective:   Physical Exam Eyes:     General: No scleral icterus. Cardiovascular:     Rate and Rhythm: Normal rate and regular rhythm.  Neurological:     General: No focal deficit present.     Mental Status: He is alert.  Psychiatric:        Mood and Affect: Mood normal.   SH: no tobacco        Assessment & Plan:

## 2020-12-11 NOTE — Assessment & Plan Note (Signed)
Doing well, no concerns.  Discussed the 'detected' status but < 20 so remains good and considered undetectable.   rtc in 6 months.

## 2020-12-11 NOTE — Assessment & Plan Note (Signed)
Much better now with counseling and job change.  He is going to continue with counseling through his employer since it is offered there.

## 2020-12-11 NOTE — Assessment & Plan Note (Signed)
He is working on his weight and has a good plan now.  I encouraged continued efforts.

## 2021-01-08 ENCOUNTER — Other Ambulatory Visit: Payer: Self-pay | Admitting: Internal Medicine

## 2021-01-08 DIAGNOSIS — B20 Human immunodeficiency virus [HIV] disease: Secondary | ICD-10-CM

## 2021-05-30 ENCOUNTER — Other Ambulatory Visit: Payer: 59

## 2021-05-30 ENCOUNTER — Other Ambulatory Visit: Payer: Self-pay

## 2021-05-30 ENCOUNTER — Other Ambulatory Visit (HOSPITAL_COMMUNITY)
Admission: RE | Admit: 2021-05-30 | Discharge: 2021-05-30 | Disposition: A | Discharge status: Home / Self Care | Payer: 59 | Source: Ambulatory Visit | Attending: Internal Medicine | Admitting: Internal Medicine

## 2021-05-30 DIAGNOSIS — Z113 Encounter for screening for infections with a predominantly sexual mode of transmission: Secondary | ICD-10-CM | POA: Insufficient documentation

## 2021-05-30 DIAGNOSIS — B20 Human immunodeficiency virus [HIV] disease: Secondary | ICD-10-CM | POA: Diagnosis present

## 2021-05-30 DIAGNOSIS — Z79899 Other long term (current) drug therapy: Secondary | ICD-10-CM

## 2021-05-31 LAB — T-HELPER CELL (CD4) - (RCID CLINIC ONLY)
CD4 % Helper T Cell: 35 % (ref 33–65)
CD4 T Cell Abs: 891 /uL (ref 400–1790)

## 2021-05-31 LAB — URINE CYTOLOGY ANCILLARY ONLY
Chlamydia: NEGATIVE
Comment: NEGATIVE
Comment: NORMAL
Neisseria Gonorrhea: NEGATIVE

## 2021-06-02 LAB — CBC WITH DIFFERENTIAL/PLATELET
Absolute Monocytes: 486 cells/uL (ref 200–950)
Basophils Absolute: 122 cells/uL (ref 0–200)
Basophils Relative: 1.9 %
Eosinophils Absolute: 314 cells/uL (ref 15–500)
Eosinophils Relative: 4.9 %
HCT: 45.2 % (ref 38.5–50.0)
Hemoglobin: 15.4 g/dL (ref 13.2–17.1)
Lymphs Abs: 3258 cells/uL (ref 850–3900)
MCH: 29.8 pg (ref 27.0–33.0)
MCHC: 34.1 g/dL (ref 32.0–36.0)
MCV: 87.4 fL (ref 80.0–100.0)
MPV: 10.1 fL (ref 7.5–12.5)
Monocytes Relative: 7.6 %
Neutro Abs: 2221 cells/uL (ref 1500–7800)
Neutrophils Relative %: 34.7 %
Platelets: 320 10*3/uL (ref 140–400)
RBC: 5.17 10*6/uL (ref 4.20–5.80)
RDW: 13.3 % (ref 11.0–15.0)
Total Lymphocyte: 50.9 %
WBC: 6.4 10*3/uL (ref 3.8–10.8)

## 2021-06-02 LAB — HIV-1 RNA QUANT-NO REFLEX-BLD
HIV 1 RNA Quant: 21 Copies/mL — ABNORMAL HIGH
HIV-1 RNA Quant, Log: 1.31 Log cps/mL — ABNORMAL HIGH

## 2021-06-02 LAB — COMPLETE METABOLIC PANEL WITH GFR
AG Ratio: 1.4 (calc) (ref 1.0–2.5)
ALT: 36 U/L (ref 9–46)
AST: 27 U/L (ref 10–40)
Albumin: 4.3 g/dL (ref 3.6–5.1)
Alkaline phosphatase (APISO): 60 U/L (ref 36–130)
BUN: 9 mg/dL (ref 7–25)
CO2: 27 mmol/L (ref 20–32)
Calcium: 9.9 mg/dL (ref 8.6–10.3)
Chloride: 106 mmol/L (ref 98–110)
Creat: 1.05 mg/dL (ref 0.60–1.26)
Globulin: 3.1 g/dL (calc) (ref 1.9–3.7)
Glucose, Bld: 93 mg/dL (ref 65–99)
Potassium: 4.1 mmol/L (ref 3.5–5.3)
Sodium: 141 mmol/L (ref 135–146)
Total Bilirubin: 0.6 mg/dL (ref 0.2–1.2)
Total Protein: 7.4 g/dL (ref 6.1–8.1)
eGFR: 93 mL/min/{1.73_m2} (ref >=60)

## 2021-06-02 LAB — LIPID PANEL
Cholesterol: 197 mg/dL (ref <200)
HDL: 33 mg/dL — ABNORMAL LOW (ref >=40)
LDL Cholesterol (Calc): 134 mg/dL (calc) — ABNORMAL HIGH
Non-HDL Cholesterol (Calc): 164 mg/dL (calc) — ABNORMAL HIGH (ref <130)
Total CHOL/HDL Ratio: 6 (calc) — ABNORMAL HIGH (ref <5.0)
Triglycerides: 162 mg/dL — ABNORMAL HIGH (ref <150)

## 2021-06-02 LAB — RPR: RPR Ser Ql: NONREACTIVE

## 2021-06-13 ENCOUNTER — Encounter: Payer: Self-pay | Admitting: Internal Medicine

## 2021-06-13 ENCOUNTER — Ambulatory Visit: Payer: 59 | Admitting: Internal Medicine

## 2021-06-13 ENCOUNTER — Other Ambulatory Visit: Payer: Self-pay

## 2021-06-13 ENCOUNTER — Telehealth: Payer: Self-pay

## 2021-06-13 ENCOUNTER — Other Ambulatory Visit (HOSPITAL_COMMUNITY)
Admission: RE | Admit: 2021-06-13 | Discharge: 2021-06-13 | Disposition: A | Discharge status: Home / Self Care | Payer: 59 | Source: Ambulatory Visit | Attending: Internal Medicine | Admitting: Internal Medicine

## 2021-06-13 VITALS — BP 142/83 | HR 82 | Temp 98.1°F | Resp 16 | Ht 69.0 in | Wt 252.0 lb

## 2021-06-13 DIAGNOSIS — Z5181 Encounter for therapeutic drug level monitoring: Secondary | ICD-10-CM | POA: Diagnosis not present

## 2021-06-13 DIAGNOSIS — Z113 Encounter for screening for infections with a predominantly sexual mode of transmission: Secondary | ICD-10-CM | POA: Insufficient documentation

## 2021-06-13 DIAGNOSIS — B20 Human immunodeficiency virus [HIV] disease: Secondary | ICD-10-CM

## 2021-06-13 DIAGNOSIS — E785 Hyperlipidemia, unspecified: Secondary | ICD-10-CM

## 2021-06-13 DIAGNOSIS — A759 Typhus fever, unspecified: Secondary | ICD-10-CM | POA: Insufficient documentation

## 2021-06-13 MED ORDER — GENVOYA 150-150-200-10 MG PO TABS: 1.0000 | ORAL_TABLET | Freq: Every day | ORAL | 11 refills | Status: DC

## 2021-06-13 NOTE — Assessment & Plan Note (Signed)
Reviewed cholesterol with him and improved LDL after changing to a vegan diet.  Will continue to monitor.

## 2021-06-13 NOTE — Assessment & Plan Note (Signed)
Creat, LFTs wnl.  

## 2021-06-13 NOTE — Assessment & Plan Note (Signed)
He continues to do well on Genvoya with no issues.  No changes indicated.  rtc in 6 months.

## 2021-06-13 NOTE — Addendum Note (Signed)
Addended by: Gardiner Barefoot on: 06/13/2021 05:22 PM   Modules accepted: Orders

## 2021-06-13 NOTE — Telephone Encounter (Signed)
Placed on [facility] waiting list.

## 2021-06-13 NOTE — Progress Notes (Signed)
   Subjective:    Patient ID: Jesse Frank, male    DOB: February 17, 1981, 40 y.o.   MRN: [id number]  HPI Here for follow up of HIV He continues on Genvoya with no missed doses.  CD4 891 and viral load just 21 copies.  No issues with getting, taking or tolerating the medications.  No complaints.    Review of Systems  Constitutional:  Negative for fatigue.  Gastrointestinal:  Negative for diarrhea and nausea.  Skin:  Negative for rash.      Objective:   Physical Exam Eyes:     General: No scleral icterus. Pulmonary:     Effort: Pulmonary effort is normal.  Neurological:     General: No focal deficit present.     Mental Status: He is alert.  Psychiatric:        Mood and Affect: Mood normal.   SH: no tobacco       Assessment & Plan:

## 2021-06-13 NOTE — Assessment & Plan Note (Signed)
Screened negative.  Will screen throat today as well.  No other risks

## 2021-06-14 LAB — CYTOLOGY, (ORAL, ANAL, URETHRAL) ANCILLARY ONLY
Chlamydia: NEGATIVE
Comment: NEGATIVE
Comment: NORMAL
Neisseria Gonorrhea: NEGATIVE

## 2021-12-05 ENCOUNTER — Other Ambulatory Visit: Payer: 59

## 2021-12-05 ENCOUNTER — Other Ambulatory Visit: Payer: Self-pay

## 2021-12-05 DIAGNOSIS — B20 Human immunodeficiency virus [HIV] disease: Secondary | ICD-10-CM

## 2021-12-06 LAB — T-HELPER CELL (CD4) - (RCID CLINIC ONLY)
CD4 % Helper T Cell: 38 % (ref 33–65)
CD4 T Cell Abs: 888 /uL (ref 400–1790)

## 2021-12-09 LAB — HIV-1 RNA QUANT-NO REFLEX-BLD
HIV 1 RNA Quant: NOT DETECTED Copies/mL
HIV-1 RNA Quant, Log: NOT DETECTED Log cps/mL

## 2021-12-19 ENCOUNTER — Other Ambulatory Visit: Payer: Self-pay

## 2021-12-19 ENCOUNTER — Ambulatory Visit: Payer: 59 | Admitting: Internal Medicine

## 2021-12-19 ENCOUNTER — Encounter: Payer: Self-pay | Admitting: Internal Medicine

## 2021-12-19 VITALS — BP 145/82 | HR 79 | Temp 98.7°F | Ht 67.0 in | Wt 277.0 lb

## 2021-12-19 DIAGNOSIS — Z113 Encounter for screening for infections with a predominantly sexual mode of transmission: Secondary | ICD-10-CM | POA: Diagnosis not present

## 2021-12-19 DIAGNOSIS — B20 Human immunodeficiency virus [HIV] disease: Secondary | ICD-10-CM

## 2021-12-19 DIAGNOSIS — E785 Hyperlipidemia, unspecified: Secondary | ICD-10-CM

## 2021-12-19 DIAGNOSIS — Z0000 Encounter for general adult medical examination without abnormal findings: Secondary | ICD-10-CM

## 2021-12-19 MED ORDER — GENVOYA 150-150-200-10 MG PO TABS: 1.0000 | ORAL_TABLET | Freq: Every day | ORAL | 11 refills | Status: DC

## 2021-12-19 NOTE — Assessment & Plan Note (Signed)
I discussed Prevnar 20, though not due until 2024 with newer guidance.   ?

## 2021-12-19 NOTE — Assessment & Plan Note (Signed)
He continues to do well, no new issues and no changes to his medication indicated.   ?Continue Genvoya and rtc in 6 months.  ?

## 2021-12-19 NOTE — Progress Notes (Signed)
? ?  Subjective:  ? ? Patient ID: Jesse Frank, male    DOB: 05-20-1981, 41 y.o.   MRN: [id number] ? ?HPI ?Here for follow up of HIV ?He continues on Genvoya and denies any missed doses.  No issues with getting, taking or tolerating the medication.  No complaints today.   ? ? ?Review of Systems  ?Constitutional:  Negative for fatigue.  ?Gastrointestinal:  Negative for diarrhea and nausea.  ?Skin:  Negative for rash.  ? ?   ?Objective:  ? Physical Exam ?Eyes:  ?   General: No scleral icterus. ?Pulmonary:  ?   Effort: Pulmonary effort is normal.  ?Skin: ?   Findings: No rash.  ?Neurological:  ?   General: No focal deficit present.  ?   Mental Status: He is alert.  ?Psychiatric:     ?   Mood and Affect: Mood normal.  ? ? ? ? ? ?   ?Assessment & Plan:  ? ? ?

## 2022-06-13 ENCOUNTER — Other Ambulatory Visit: Payer: Self-pay

## 2022-06-13 ENCOUNTER — Other Ambulatory Visit: Payer: 59

## 2022-06-13 DIAGNOSIS — B20 Human immunodeficiency virus [HIV] disease: Secondary | ICD-10-CM

## 2022-06-13 DIAGNOSIS — E785 Hyperlipidemia, unspecified: Secondary | ICD-10-CM

## 2022-06-13 DIAGNOSIS — Z113 Encounter for screening for infections with a predominantly sexual mode of transmission: Secondary | ICD-10-CM

## 2022-06-14 LAB — T-HELPER CELL (CD4) - (RCID CLINIC ONLY)
CD4 % Helper T Cell: 37 % (ref 33–65)
CD4 T Cell Abs: 1010 /uL (ref 400–1790)

## 2022-06-17 LAB — COMPLETE METABOLIC PANEL WITH GFR
AG Ratio: 1.3 (calc) (ref 1.0–2.5)
ALT: 34 U/L (ref 9–46)
AST: 21 U/L (ref 10–40)
Albumin: 4.2 g/dL (ref 3.6–5.1)
Alkaline phosphatase (APISO): 53 U/L (ref 36–130)
BUN: 9 mg/dL (ref 7–25)
CO2: 27 mmol/L (ref 20–32)
Calcium: 9.2 mg/dL (ref 8.6–10.3)
Chloride: 104 mmol/L (ref 98–110)
Creat: 1.16 mg/dL (ref 0.60–1.29)
Globulin: 3.2 g/dL (calc) (ref 1.9–3.7)
Glucose, Bld: 93 mg/dL (ref 65–99)
Potassium: 3.9 mmol/L (ref 3.5–5.3)
Sodium: 141 mmol/L (ref 135–146)
Total Bilirubin: 0.4 mg/dL (ref 0.2–1.2)
Total Protein: 7.4 g/dL (ref 6.1–8.1)
eGFR: 81 mL/min/{1.73_m2} (ref >=60)

## 2022-06-17 LAB — CBC WITH DIFFERENTIAL/PLATELET
Absolute Monocytes: 492 cells/uL (ref 200–950)
Basophils Absolute: 48 cells/uL (ref 0–200)
Basophils Relative: 0.8 %
Eosinophils Absolute: 174 cells/uL (ref 15–500)
Eosinophils Relative: 2.9 %
HCT: 47.3 % (ref 38.5–50.0)
Hemoglobin: 16.1 g/dL (ref 13.2–17.1)
Lymphs Abs: 3102 cells/uL (ref 850–3900)
MCH: 29.1 pg (ref 27.0–33.0)
MCHC: 34 g/dL (ref 32.0–36.0)
MCV: 85.5 fL (ref 80.0–100.0)
MPV: 10.3 fL (ref 7.5–12.5)
Monocytes Relative: 8.2 %
Neutro Abs: 2184 cells/uL (ref 1500–7800)
Neutrophils Relative %: 36.4 %
Platelets: 297 10*3/uL (ref 140–400)
RBC: 5.53 10*6/uL (ref 4.20–5.80)
RDW: 13.5 % (ref 11.0–15.0)
Total Lymphocyte: 51.7 %
WBC: 6 10*3/uL (ref 3.8–10.8)

## 2022-06-17 LAB — LIPID PANEL
Cholesterol: 245 mg/dL — ABNORMAL HIGH (ref <200)
HDL: 36 mg/dL — ABNORMAL LOW (ref >=40)
LDL Cholesterol (Calc): 169 mg/dL (calc) — ABNORMAL HIGH
Non-HDL Cholesterol (Calc): 209 mg/dL (calc) — ABNORMAL HIGH (ref <130)
Total CHOL/HDL Ratio: 6.8 (calc) — ABNORMAL HIGH (ref <5.0)
Triglycerides: 234 mg/dL — ABNORMAL HIGH (ref <150)

## 2022-06-17 LAB — RPR: RPR Ser Ql: NONREACTIVE

## 2022-06-17 LAB — HIV-1 RNA QUANT-NO REFLEX-BLD
HIV 1 RNA Quant: <20 Copies/mL — ABNORMAL HIGH
HIV-1 RNA Quant, Log: <1.3 Log cps/mL — ABNORMAL HIGH

## 2022-06-27 ENCOUNTER — Other Ambulatory Visit: Payer: Self-pay

## 2022-06-27 ENCOUNTER — Ambulatory Visit: Payer: 59 | Admitting: Internal Medicine

## 2022-06-27 ENCOUNTER — Encounter: Payer: Self-pay | Admitting: Internal Medicine

## 2022-06-27 VITALS — BP 144/80 | HR 92 | Resp 16 | Ht 67.0 in | Wt 288.4 lb

## 2022-06-27 DIAGNOSIS — Z5181 Encounter for therapeutic drug level monitoring: Secondary | ICD-10-CM

## 2022-06-27 DIAGNOSIS — Z113 Encounter for screening for infections with a predominantly sexual mode of transmission: Secondary | ICD-10-CM | POA: Diagnosis not present

## 2022-06-27 DIAGNOSIS — B20 Human immunodeficiency virus [HIV] disease: Secondary | ICD-10-CM

## 2022-06-27 DIAGNOSIS — E785 Hyperlipidemia, unspecified: Secondary | ICD-10-CM

## 2022-06-27 MED ORDER — GENVOYA 150-150-200-10 MG PO TABS: 1.0000 | ORAL_TABLET | Freq: Every day | ORAL | 11 refills | Status: DC

## 2022-06-27 MED ORDER — ROSUVASTATIN CALCIUM 10 MG PO TABS: 10.0000 mg | ORAL_TABLET | Freq: Every day | ORAL | 11 refills | Status: DC

## 2022-06-27 NOTE — Assessment & Plan Note (Signed)
Screened negative, condoms provided.

## 2022-06-27 NOTE — Assessment & Plan Note (Signed)
He continues to do well on Genvoya and no changes indicated.    With new research coming out by way of the [place] study regarding cardiovascular event risk reduction for PLWH, I discussed that over the 8 year trial initiation of statin (pitavastatin) therapy was shown to reduce CV disease by 35% independent of other risk factors.   The 10-year ASCVD risk score (Arnett DK, et al., 2019) is: 4.8%   Values used to calculate the score:     Age: 41 years     Sex: Male     Is Non-Hispanic African American: Yes     Diabetic: No     Tobacco smoker: No     Systolic Blood Pressure: 623 mmHg     Is BP treated: No     HDL Cholesterol: 36 mg/dL     Total Cholesterol: 245 mg/dL  We discussed the patient's 10-year CVD Risk Score to be at least moderately elevated, and would benefit from intervention given HIV+ and > 20 yo.   Will proceed with lower-intensity statin (pitavastatin 4 mg preferred) given non-diabetic.  Given co-morbidities will start rosuvastatin with referral to cardiology/primary care to optimize dose. Lifestyle recommendations also discussed today.

## 2022-06-27 NOTE — Progress Notes (Signed)
   Subjective:    Patient ID: Jesse Frank, male    DOB: [birth year], 41 y.o.   MRN: [id number]  HPI Here for follow up of HIV He continues on Genvoya and denies any missed doses.  No issues with getting or taking his medication.  Working on his weight.     Review of Systems  Constitutional:  Negative for fatigue.  Gastrointestinal:  Negative for diarrhea and nausea.  Skin:  Negative for rash.       Objective:   Physical Exam Eyes:     General: No scleral icterus. Pulmonary:     Effort: Pulmonary effort is normal.  Neurological:     General: No focal deficit present.     Mental Status: He is alert.  Psychiatric:        Mood and Affect: Mood normal.    SH: no tobacco       Assessment & Plan:

## 2022-06-27 NOTE — Assessment & Plan Note (Signed)
Creat, LFTs wnl.  

## 2022-11-21 ENCOUNTER — Other Ambulatory Visit: Payer: 59

## 2022-11-21 ENCOUNTER — Other Ambulatory Visit: Payer: Self-pay

## 2022-11-21 DIAGNOSIS — B20 Human immunodeficiency virus [HIV] disease: Secondary | ICD-10-CM

## 2022-11-21 DIAGNOSIS — E785 Hyperlipidemia, unspecified: Secondary | ICD-10-CM

## 2022-11-22 LAB — T-HELPER CELL (CD4) - (RCID CLINIC ONLY)
CD4 % Helper T Cell: 36 % (ref 33–65)
CD4 T Cell Abs: 881 /uL (ref 400–1790)

## 2022-11-23 LAB — COMPLETE METABOLIC PANEL WITH GFR
AG Ratio: 1.4 (calc) (ref 1.0–2.5)
ALT: 32 U/L (ref 9–46)
AST: 22 U/L (ref 10–40)
Albumin: 4.4 g/dL (ref 3.6–5.1)
Alkaline phosphatase (APISO): 52 U/L (ref 36–130)
BUN: 14 mg/dL (ref 7–25)
CO2: 28 mmol/L (ref 20–32)
Calcium: 9.9 mg/dL (ref 8.6–10.3)
Chloride: 105 mmol/L (ref 98–110)
Creat: 1.11 mg/dL (ref 0.60–1.29)
Globulin: 3.2 g/dL (calc) (ref 1.9–3.7)
Glucose, Bld: 92 mg/dL (ref 65–99)
Potassium: 4.2 mmol/L (ref 3.5–5.3)
Sodium: 141 mmol/L (ref 135–146)
Total Bilirubin: 0.6 mg/dL (ref 0.2–1.2)
Total Protein: 7.6 g/dL (ref 6.1–8.1)
eGFR: 86 mL/min/{1.73_m2} (ref >=60)

## 2022-11-23 LAB — LIPID PANEL
Cholesterol: 225 mg/dL — ABNORMAL HIGH (ref <200)
HDL: 39 mg/dL — ABNORMAL LOW (ref >=40)
LDL Cholesterol (Calc): 150 mg/dL (calc) — ABNORMAL HIGH
Non-HDL Cholesterol (Calc): 186 mg/dL (calc) — ABNORMAL HIGH (ref <130)
Total CHOL/HDL Ratio: 5.8 (calc) — ABNORMAL HIGH (ref <5.0)
Triglycerides: 225 mg/dL — ABNORMAL HIGH (ref <150)

## 2022-11-23 LAB — HIV-1 RNA QUANT-NO REFLEX-BLD
HIV 1 RNA Quant: 36 Copies/mL — ABNORMAL HIGH
HIV-1 RNA Quant, Log: 1.55 Log cps/mL — ABNORMAL HIGH

## 2022-12-05 ENCOUNTER — Other Ambulatory Visit (HOSPITAL_COMMUNITY)
Admission: RE | Admit: 2022-12-05 | Discharge: 2022-12-05 | Disposition: A | Discharge status: Home / Self Care | Payer: 59 | Source: Ambulatory Visit | Attending: Internal Medicine | Admitting: Internal Medicine

## 2022-12-05 ENCOUNTER — Encounter: Payer: Self-pay | Admitting: Internal Medicine

## 2022-12-05 ENCOUNTER — Other Ambulatory Visit: Payer: Self-pay

## 2022-12-05 ENCOUNTER — Ambulatory Visit (INDEPENDENT_AMBULATORY_CARE_PROVIDER_SITE_OTHER): Payer: 59 | Admitting: Internal Medicine

## 2022-12-05 VITALS — BP 172/106 | HR 90 | Temp 98.2°F | Ht 66.5 in | Wt 288.0 lb

## 2022-12-05 DIAGNOSIS — B20 Human immunodeficiency virus [HIV] disease: Secondary | ICD-10-CM | POA: Diagnosis not present

## 2022-12-05 DIAGNOSIS — E785 Hyperlipidemia, unspecified: Secondary | ICD-10-CM | POA: Diagnosis not present

## 2022-12-05 DIAGNOSIS — I10 Essential (primary) hypertension: Secondary | ICD-10-CM | POA: Diagnosis not present

## 2022-12-05 DIAGNOSIS — Z113 Encounter for screening for infections with a predominantly sexual mode of transmission: Secondary | ICD-10-CM

## 2022-12-05 DIAGNOSIS — Z5181 Encounter for therapeutic drug level monitoring: Secondary | ICD-10-CM

## 2022-12-05 NOTE — Assessment & Plan Note (Signed)
Will screen 

## 2022-12-05 NOTE — Assessment & Plan Note (Signed)
BP up today and he has monitored before, though not quite so elevated as today.  He will get a pcp asap.

## 2022-12-05 NOTE — Assessment & Plan Note (Signed)
Discussed again the benefits of the Reprieve trial. He will take rosuvastatin again Work on diet modifications

## 2022-12-05 NOTE — Assessment & Plan Note (Signed)
Creat, LFTs wnl and reviewed with him.

## 2022-12-05 NOTE — Assessment & Plan Note (Signed)
Doing well on Genvoya and labs reviewed with him.   Continue and follow up in 6 months.

## 2022-12-05 NOTE — Progress Notes (Signed)
   Subjective:    Patient ID: Jesse Frank, male    DOB: Oct 21, 1980, 42 y.o.   MRN: [id number]  HPI Rica Mast is here for follow up of HIV He continues on Genvoya and no issues.  No missed doses and no problems getting or taking.  He has not been taking the statin well.  LDL up from previous.  Looking for a PCP.  He is concerned with his health since his father has advanced heart failure and advanced renal disease.     Review of Systems  Constitutional:  Negative for fatigue.  Gastrointestinal:  Negative for diarrhea.  Skin:  Negative for rash.       Objective:   Physical Exam Eyes:     General: No scleral icterus. Pulmonary:     Effort: Pulmonary effort is normal.  Neurological:     Mental Status: He is alert.   SH: no tobacco        Assessment & Plan:

## 2022-12-06 LAB — URINE CYTOLOGY ANCILLARY ONLY
Chlamydia: NEGATIVE
Comment: NEGATIVE
Comment: NORMAL
Neisseria Gonorrhea: NEGATIVE

## 2022-12-06 LAB — RPR: RPR Ser Ql: NONREACTIVE

## 2023-05-22 ENCOUNTER — Other Ambulatory Visit: Payer: Self-pay

## 2023-05-22 ENCOUNTER — Other Ambulatory Visit: Payer: 59

## 2023-05-22 DIAGNOSIS — B20 Human immunodeficiency virus [HIV] disease: Secondary | ICD-10-CM

## 2023-05-23 LAB — T-HELPER CELL (CD4) - (RCID CLINIC ONLY)
CD4 % Helper T Cell: 30 % — ABNORMAL LOW (ref 33–65)
CD4 T Cell Abs: 807 /uL (ref 400–1790)

## 2023-05-24 LAB — HIV-1 RNA QUANT-NO REFLEX-BLD
HIV 1 RNA Quant: 323 Copies/mL — ABNORMAL HIGH
HIV-1 RNA Quant, Log: 2.51 Log cps/mL — ABNORMAL HIGH

## 2023-06-05 ENCOUNTER — Other Ambulatory Visit: Payer: Self-pay

## 2023-06-05 ENCOUNTER — Encounter: Payer: Self-pay | Admitting: Internal Medicine

## 2023-06-05 ENCOUNTER — Ambulatory Visit: Payer: 59 | Admitting: Internal Medicine

## 2023-06-05 ENCOUNTER — Other Ambulatory Visit: Payer: Self-pay | Admitting: Internal Medicine

## 2023-06-05 VITALS — BP 161/97 | HR 83 | Temp 98.5°F | Wt 290.0 lb

## 2023-06-05 DIAGNOSIS — B20 Human immunodeficiency virus [HIV] disease: Secondary | ICD-10-CM

## 2023-06-05 DIAGNOSIS — E785 Hyperlipidemia, unspecified: Secondary | ICD-10-CM

## 2023-06-05 MED ORDER — ROSUVASTATIN CALCIUM 10 MG PO TABS: 10.0000 mg | ORAL_TABLET | Freq: Every day | ORAL | 1 refills | Status: DC

## 2023-06-05 MED ORDER — GENVOYA 150-150-200-10 MG PO TABS: 1.0000 | ORAL_TABLET | Freq: Every day | ORAL | 1 refills | Status: DC

## 2023-06-05 NOTE — Progress Notes (Signed)
   Subjective:    Patient ID: Ladale Sherburn, male    DOB: [birth year], 43 y.o.   MRN: [id number]  HPI [name] is here for follow up of HIV He has been on Genvoya but recently has been off his medications.  His father is now in hospice and he has been understandably very down/depressed about this.  He reports his father has lost about half his weight.  He has not taken it in about 1 month.  He is going to counseling next week and otherwise has family support.  No weight loss, no diarrhea.    Review of Systems  Constitutional:  Negative for fatigue.  Gastrointestinal:  Negative for diarrhea and nausea.  Psychiatric/Behavioral:  Positive for dysphoric mood. Negative for suicidal ideas.        Objective:   Physical Exam Eyes:     General: No scleral icterus. Pulmonary:     Effort: Pulmonary effort is normal.  Skin:    Findings: No rash.  Neurological:     Mental Status: He is alert.  Psychiatric:        Mood and Affect: Mood normal.        Thought Content: Thought content normal.        Judgment: Judgment normal.     Comments: Depressed, tearful    SH: no tobacco       Assessment & Plan:

## 2023-06-05 NOTE — Assessment & Plan Note (Signed)
Poorly controlled now and his viral load is in the 300s.  Baseline 40,000 and suspect he is there now.  CD4 intact.  Discussed his situation and importance of getting back on the medication and self-care.   He will get back on his medication now and I will see him back in one month.   Gets flu and COVID at his work  I have personally spent 42 minutes involved in face-to-face and non-face-to-face activities for this patient on the day of the visit. Professional time spent includes the following activities: Preparing to see the patient (review of tests), Obtaining and/or reviewing separately obtained history (admission/discharge record), Performing a medically appropriate examination and/or evaluation , Ordering medications/tests/procedures, referring and communicating with other health care professionals, Documenting clinical information in the EMR, Independently interpreting results (not separately reported), Communicating results to the patient/family/caregiver, Counseling and educating the patient/family/caregiver and Care coordination (not separately reported).

## 2023-07-02 ENCOUNTER — Other Ambulatory Visit: Payer: Self-pay

## 2023-07-02 DIAGNOSIS — Z113 Encounter for screening for infections with a predominantly sexual mode of transmission: Secondary | ICD-10-CM

## 2023-07-02 DIAGNOSIS — Z79899 Other long term (current) drug therapy: Secondary | ICD-10-CM

## 2023-07-02 DIAGNOSIS — B20 Human immunodeficiency virus [HIV] disease: Secondary | ICD-10-CM

## 2023-07-08 ENCOUNTER — Ambulatory Visit: Payer: 59 | Admitting: Internal Medicine

## 2023-07-09 ENCOUNTER — Other Ambulatory Visit (HOSPITAL_COMMUNITY)
Admission: RE | Admit: 2023-07-09 | Discharge: 2023-07-09 | Disposition: A | Discharge status: Home / Self Care | Payer: 59 | Source: Ambulatory Visit | Attending: Internal Medicine | Admitting: Internal Medicine

## 2023-07-09 ENCOUNTER — Other Ambulatory Visit: Payer: Self-pay

## 2023-07-09 ENCOUNTER — Other Ambulatory Visit: Payer: 59

## 2023-07-09 DIAGNOSIS — B20 Human immunodeficiency virus [HIV] disease: Secondary | ICD-10-CM | POA: Insufficient documentation

## 2023-07-09 DIAGNOSIS — Z113 Encounter for screening for infections with a predominantly sexual mode of transmission: Secondary | ICD-10-CM | POA: Diagnosis present

## 2023-07-09 DIAGNOSIS — Z79899 Other long term (current) drug therapy: Secondary | ICD-10-CM

## 2023-07-10 LAB — T-HELPER CELL (CD4) - (RCID CLINIC ONLY)
CD4 % Helper T Cell: 31 % — ABNORMAL LOW (ref 33–65)
CD4 T Cell Abs: 619 /uL (ref 400–1790)

## 2023-07-10 LAB — URINE CYTOLOGY ANCILLARY ONLY
Chlamydia: NEGATIVE
Comment: NEGATIVE
Comment: NORMAL
Neisseria Gonorrhea: NEGATIVE

## 2023-07-12 LAB — CBC WITH DIFFERENTIAL/PLATELET
Absolute Lymphocytes: 2245 {cells}/uL (ref 850–3900)
Absolute Monocytes: 429 {cells}/uL (ref 200–950)
Basophils Absolute: 29 {cells}/uL (ref 0–200)
Basophils Relative: 0.5 %
Eosinophils Absolute: 99 {cells}/uL (ref 15–500)
Eosinophils Relative: 1.7 %
HCT: 47.3 % (ref 38.5–50.0)
Hemoglobin: 15.8 g/dL (ref 13.2–17.1)
MCH: 29.2 pg (ref 27.0–33.0)
MCHC: 33.4 g/dL (ref 32.0–36.0)
MCV: 87.3 fL (ref 80.0–100.0)
MPV: 10 fL (ref 7.5–12.5)
Monocytes Relative: 7.4 %
Neutro Abs: 2999 {cells}/uL (ref 1500–7800)
Neutrophils Relative %: 51.7 %
Platelets: 285 10*3/uL (ref 140–400)
RBC: 5.42 10*6/uL (ref 4.20–5.80)
RDW: 14.1 % (ref 11.0–15.0)
Total Lymphocyte: 38.7 %
WBC: 5.8 10*3/uL (ref 3.8–10.8)

## 2023-07-12 LAB — COMPLETE METABOLIC PANEL WITH GFR
AG Ratio: 1.2 (calc) (ref 1.0–2.5)
ALT: 38 U/L (ref 9–46)
AST: 22 U/L (ref 10–40)
Albumin: 4.4 g/dL (ref 3.6–5.1)
Alkaline phosphatase (APISO): 54 U/L (ref 36–130)
BUN: 14 mg/dL (ref 7–25)
CO2: 27 mmol/L (ref 20–32)
Calcium: 9.8 mg/dL (ref 8.6–10.3)
Chloride: 103 mmol/L (ref 98–110)
Creat: 1.16 mg/dL (ref 0.60–1.29)
Globulin: 3.7 g/dL (ref 1.9–3.7)
Glucose, Bld: 101 mg/dL — ABNORMAL HIGH (ref 65–99)
Potassium: 3.9 mmol/L (ref 3.5–5.3)
Sodium: 139 mmol/L (ref 135–146)
Total Bilirubin: 0.5 mg/dL (ref 0.2–1.2)
Total Protein: 8.1 g/dL (ref 6.1–8.1)
eGFR: 81 mL/min/{1.73_m2} (ref >=60)

## 2023-07-12 LAB — LIPID PANEL
Cholesterol: 224 mg/dL — ABNORMAL HIGH (ref <200)
HDL: 46 mg/dL (ref >=40)
LDL Cholesterol (Calc): 152 mg/dL — ABNORMAL HIGH
Non-HDL Cholesterol (Calc): 178 mg/dL — ABNORMAL HIGH (ref <130)
Total CHOL/HDL Ratio: 4.9 (calc) (ref <5.0)
Triglycerides: 140 mg/dL (ref <150)

## 2023-07-12 LAB — RPR: RPR Ser Ql: NONREACTIVE

## 2023-07-12 LAB — HIV-1 RNA QUANT-NO REFLEX-BLD
HIV 1 RNA Quant: 70 {copies}/mL — ABNORMAL HIGH
HIV-1 RNA Quant, Log: 1.84 {Log_copies}/mL — ABNORMAL HIGH

## 2023-07-25 ENCOUNTER — Ambulatory Visit: Payer: 59 | Admitting: Internal Medicine

## 2023-07-25 ENCOUNTER — Other Ambulatory Visit: Payer: Self-pay

## 2023-07-25 ENCOUNTER — Encounter: Payer: Self-pay | Admitting: Internal Medicine

## 2023-07-25 VITALS — BP 177/97 | HR 93 | Ht 67.0 in | Wt 291.0 lb

## 2023-07-25 DIAGNOSIS — F4321 Adjustment disorder with depressed mood: Secondary | ICD-10-CM | POA: Insufficient documentation

## 2023-07-25 DIAGNOSIS — E785 Hyperlipidemia, unspecified: Secondary | ICD-10-CM | POA: Diagnosis not present

## 2023-07-25 DIAGNOSIS — B20 Human immunodeficiency virus [HIV] disease: Secondary | ICD-10-CM

## 2023-07-25 DIAGNOSIS — Z113 Encounter for screening for infections with a predominantly sexual mode of transmission: Secondary | ICD-10-CM

## 2023-07-25 NOTE — Assessment & Plan Note (Signed)
Much better, back on track.  Recent labs reviewed with him and some detectable virus but less than 100 so seems stable now.   He will continue with his ARVs and follow up in about 4 months.

## 2023-07-25 NOTE — Assessment & Plan Note (Signed)
Discussed his grieving with his father and he is getting appropriate support with counseling and has family support.

## 2023-07-25 NOTE — Progress Notes (Signed)
   Subjective:    Patient ID: Jesse Frank, male    DOB: 27-Jun-1981, 42 y.o.   MRN: [id number]  HPI Jesse Frank is here for follow up of HIV I last saw him in October and he had been off his medications due to depression related to his father being in hospice, who he has been close to.  He continues in counseling and is more at peace now.  He has been on his medications and doing well, no missed doses since restarting.    Review of Systems  Constitutional:  Negative for fatigue.  Skin:  Negative for rash.       Objective:   Physical Exam Eyes:     General: No scleral icterus. Pulmonary:     Effort: Pulmonary effort is normal.  Neurological:     Mental Status: He is alert.   SH: no tobacco        Assessment & Plan:

## 2023-07-25 NOTE — Assessment & Plan Note (Signed)
Lipid panel checked, on a stating.  Stable.

## 2023-07-25 NOTE — Assessment & Plan Note (Signed)
Screened negagtive

## 2023-08-04 ENCOUNTER — Other Ambulatory Visit: Payer: Self-pay | Admitting: Internal Medicine

## 2023-08-04 DIAGNOSIS — B20 Human immunodeficiency virus [HIV] disease: Secondary | ICD-10-CM

## 2023-08-04 DIAGNOSIS — E785 Hyperlipidemia, unspecified: Secondary | ICD-10-CM

## 2023-11-10 ENCOUNTER — Other Ambulatory Visit: Payer: Self-pay

## 2023-11-10 ENCOUNTER — Other Ambulatory Visit: Payer: 59

## 2023-11-10 DIAGNOSIS — B20 Human immunodeficiency virus [HIV] disease: Secondary | ICD-10-CM

## 2023-11-11 LAB — T-HELPER CELL (CD4) - (RCID CLINIC ONLY)
CD4 % Helper T Cell: 31 % — ABNORMAL LOW (ref 33–65)
CD4 T Cell Abs: 854 /uL (ref 400–1790)

## 2023-11-14 LAB — HIV-1 RNA QUANT-NO REFLEX-BLD
HIV 1 RNA Quant: <20 {copies}/mL — ABNORMAL HIGH
HIV-1 RNA Quant, Log: <1.3 {Log_copies}/mL — ABNORMAL HIGH

## 2023-11-24 ENCOUNTER — Other Ambulatory Visit: Payer: Self-pay

## 2023-11-24 ENCOUNTER — Ambulatory Visit: Payer: Self-pay | Admitting: Internal Medicine

## 2023-11-24 ENCOUNTER — Encounter: Payer: Self-pay | Admitting: Internal Medicine

## 2023-11-24 VITALS — BP 157/90 | HR 94 | Temp 98.6°F | Resp 16 | Wt 287.0 lb

## 2023-11-24 DIAGNOSIS — B20 Human immunodeficiency virus [HIV] disease: Secondary | ICD-10-CM | POA: Diagnosis not present

## 2023-11-24 DIAGNOSIS — Z113 Encounter for screening for infections with a predominantly sexual mode of transmission: Secondary | ICD-10-CM

## 2023-11-24 DIAGNOSIS — Z79899 Other long term (current) drug therapy: Secondary | ICD-10-CM

## 2023-11-24 NOTE — Assessment & Plan Note (Signed)
 He is doing well now.  I did discuss with him the importance of the medication and he is doing well now and no new issues or concerns.  He will continue with same medication no changes indicated.  Will give refills.  He otherwise can return in 6 months.  I have personally spent 30 minutes involved in face-to-face and non-face-to-face activities for this patient on the day of the visit. Professional time spent includes the following activities: Preparing to see the patient (review of tests), Obtaining and/or reviewing separately obtained history (admission/discharge record), Performing a medically appropriate examination and/or evaluation , Ordering medications/tests/procedures, referring and communicating with other health care professionals, Documenting clinical information in the EMR, Independently interpreting results (not separately reported), Communicating results to the patient/family/caregiver, Counseling and educating the patient/family/caregiver and Care coordination (not separately reported).

## 2023-11-24 NOTE — Progress Notes (Signed)
   Subjective:    Patient ID: Jesse Frank, male    DOB: 1980-12-23, 43 y.o.   MRN: [id number]  HPI Jesse Frank is here for follow-up of HIV. He has continued on Genvoya and denies any missed doses.  He is back early after being off medication due to depression with his father's illness and death.  He continues in counseling now and continues to do well otherwise.  He feels like he is in a good place.  He has no new complaints today.   Review of Systems     Objective:   Physical Exam        Assessment & Plan:

## 2024-01-22 NOTE — Progress Notes (Signed)
 The 10-year ASCVD risk score (Arnett DK, et al., 2019) is: 5.3%   Values used to calculate the score:     Age: 43 years     Sex: Male     Is Non-Hispanic African American: Yes     Diabetic: No     Tobacco smoker: No     Systolic Blood Pressure: 157 mmHg     Is BP treated: No     HDL Cholesterol: 46 mg/dL     Total Cholesterol: 224 mg/dL  Currently prescribed rosuvastatin  10 mg.  Thecla Forgione, BSN, RN

## 2024-02-13 ENCOUNTER — Other Ambulatory Visit: Payer: Self-pay | Admitting: Internal Medicine

## 2024-02-13 DIAGNOSIS — B20 Human immunodeficiency virus [HIV] disease: Secondary | ICD-10-CM

## 2024-02-13 MED ORDER — GENVOYA 150-150-200-10 MG PO TABS: 1.0000 | ORAL_TABLET | Freq: Every day | ORAL | 11 refills | Status: DC

## 2024-02-16 ENCOUNTER — Telehealth: Payer: Self-pay

## 2024-02-16 DIAGNOSIS — B20 Human immunodeficiency virus [HIV] disease: Secondary | ICD-10-CM

## 2024-02-16 MED ORDER — GENVOYA 150-150-200-10 MG PO TABS: 1.0000 | ORAL_TABLET | Freq: Every day | ORAL | 3 refills | Status: DC

## 2024-02-16 NOTE — Telephone Encounter (Signed)
 Received call from [facility] stating patient cannot fill Genvoya  at [facility] and will need to [facility] a local [facility]. [facility] [address].   Aniceto Kyser, BSN, RN

## 2024-03-19 ENCOUNTER — Other Ambulatory Visit: Payer: Self-pay

## 2024-03-19 DIAGNOSIS — B20 Human immunodeficiency virus [HIV] disease: Secondary | ICD-10-CM

## 2024-03-19 MED ORDER — GENVOYA 150-150-200-10 MG PO TABS
1.0000 | ORAL_TABLET | Freq: Every day | ORAL | 3 refills | Status: DC
Start: 2024-03-19 — End: 2024-05-18

## 2024-04-27 ENCOUNTER — Other Ambulatory Visit: Payer: Self-pay

## 2024-04-27 DIAGNOSIS — Z113 Encounter for screening for infections with a predominantly sexual mode of transmission: Secondary | ICD-10-CM

## 2024-04-27 DIAGNOSIS — Z79899 Other long term (current) drug therapy: Secondary | ICD-10-CM

## 2024-04-27 DIAGNOSIS — B20 Human immunodeficiency virus [HIV] disease: Secondary | ICD-10-CM

## 2024-05-04 ENCOUNTER — Other Ambulatory Visit: Payer: Self-pay

## 2024-05-04 ENCOUNTER — Other Ambulatory Visit

## 2024-05-04 DIAGNOSIS — Z79899 Other long term (current) drug therapy: Secondary | ICD-10-CM

## 2024-05-04 DIAGNOSIS — B20 Human immunodeficiency virus [HIV] disease: Secondary | ICD-10-CM

## 2024-05-04 DIAGNOSIS — Z113 Encounter for screening for infections with a predominantly sexual mode of transmission: Secondary | ICD-10-CM

## 2024-05-06 LAB — COMPLETE METABOLIC PANEL WITHOUT GFR
AG Ratio: 1.5 (calc) (ref 1.0–2.5)
ALT: 36 U/L (ref 9–46)
AST: 24 U/L (ref 10–40)
Albumin: 4.3 g/dL (ref 3.6–5.1)
Alkaline phosphatase (APISO): 48 U/L (ref 36–130)
BUN: 15 mg/dL (ref 7–25)
CO2: 28 mmol/L (ref 20–32)
Calcium: 9.6 mg/dL (ref 8.6–10.3)
Chloride: 106 mmol/L (ref 98–110)
Creat: 1.23 mg/dL (ref 0.60–1.29)
Globulin: 2.9 g/dL (ref 1.9–3.7)
Glucose, Bld: 124 mg/dL — ABNORMAL HIGH (ref 65–99)
Potassium: 3.9 mmol/L (ref 3.5–5.3)
Sodium: 141 mmol/L (ref 135–146)
Total Bilirubin: 0.4 mg/dL (ref 0.2–1.2)
Total Protein: 7.2 g/dL (ref 6.1–8.1)

## 2024-05-06 LAB — LIPID PANEL
Cholesterol: 262 mg/dL — ABNORMAL HIGH (ref <200)
HDL: 39 mg/dL — ABNORMAL LOW (ref >=40)
LDL Cholesterol (Calc): 175 mg/dL — ABNORMAL HIGH
Non-HDL Cholesterol (Calc): 223 mg/dL — ABNORMAL HIGH (ref <130)
Total CHOL/HDL Ratio: 6.7 (calc) — ABNORMAL HIGH (ref <5.0)
Triglycerides: 271 mg/dL — ABNORMAL HIGH (ref <150)

## 2024-05-06 LAB — CBC WITH DIFFERENTIAL/PLATELET
Absolute Lymphocytes: 2874 {cells}/uL (ref 850–3900)
Absolute Monocytes: 360 {cells}/uL (ref 200–950)
Basophils Absolute: 60 {cells}/uL (ref 0–200)
Basophils Relative: 1 %
Eosinophils Absolute: 108 {cells}/uL (ref 15–500)
Eosinophils Relative: 1.8 %
HCT: 47.6 % (ref 38.5–50.0)
Hemoglobin: 15.8 g/dL (ref 13.2–17.1)
MCH: 29.7 pg (ref 27.0–33.0)
MCHC: 33.2 g/dL (ref 32.0–36.0)
MCV: 89.5 fL (ref 80.0–100.0)
MPV: 10.4 fL (ref 7.5–12.5)
Monocytes Relative: 6 %
Neutro Abs: 2598 {cells}/uL (ref 1500–7800)
Neutrophils Relative %: 43.3 %
Platelets: 292 Thousand/uL (ref 140–400)
RBC: 5.32 Million/uL (ref 4.20–5.80)
RDW: 14.1 % (ref 11.0–15.0)
Total Lymphocyte: 47.9 %
WBC: 6 Thousand/uL (ref 3.8–10.8)

## 2024-05-06 LAB — RPR: RPR Ser Ql: NONREACTIVE

## 2024-05-06 LAB — T-HELPER CELL (CD4) - (RCID CLINIC ONLY)
CD4 % Helper T Cell: 34 % (ref 33–65)
CD4 T Cell Abs: 871 /uL (ref 400–1790)

## 2024-05-06 LAB — HIV-1 RNA QUANT-NO REFLEX-BLD
HIV 1 RNA Quant: NOT DETECTED {copies}/mL
HIV-1 RNA Quant, Log: NOT DETECTED {Log_copies}/mL

## 2024-05-17 NOTE — Progress Notes (Unsigned)
   Subjective:  Chief complaint: follow-up for HIV disease on medications   Patient ID: Jesse Frank, male    DOB: 08-15-1981, 43 y.o.   MRN: [id number]  HPI  Past Medical History:  Diagnosis Date   HIV infection (HCC)     No past surgical history on file.  Family History  Problem Relation Age of Onset   Diabetes Mother    Diabetes Father       Social History   Socioeconomic History   Marital status: Single    Spouse name: Not on file   Number of children: Not on file   Years of education: Not on file   Highest education level: Not on file  Occupational History   Not on file  Tobacco Use   Smoking status: Never   Smokeless tobacco: Never  Substance and Sexual Activity   Alcohol use: Yes    Comment: socially, seldom   Drug use: Yes    Types: Marijuana    Comment: socially, very seldom   Sexual activity: Not Currently    Partners: Female, Male    Birth control/protection: Condom    Comment: pt. accepted condoms  Other Topics Concern   Not on file  Social History Narrative   Not on file   Social Drivers of Health   Financial Resource Strain: Not on file  Food Insecurity: Not on file  Transportation Needs: Not on file  Physical Activity: Not on file  Stress: Not on file  Social Connections: Not on file    No Known Allergies   Current Outpatient Medications:    elvitegravir-cobicistat-emtricitabine-tenofovir  (GENVOYA ) 150-150-200-10 MG TABS tablet, Take 1 tablet by mouth daily., Disp: 30 tablet, Rfl: 3   rosuvastatin  (CRESTOR ) 10 MG tablet, TAKE ONE TABLET BY MOUTH DAILY (Patient not taking: Reported on 11/24/2023), Disp: 30 tablet, Rfl: 4   Review of Systems     Objective:   Physical Exam        Assessment & Plan:

## 2024-05-18 ENCOUNTER — Other Ambulatory Visit: Payer: Self-pay

## 2024-05-18 ENCOUNTER — Ambulatory Visit: Payer: Self-pay | Admitting: Internal Medicine

## 2024-05-18 ENCOUNTER — Ambulatory Visit: Admitting: Infectious Disease

## 2024-05-18 ENCOUNTER — Encounter: Payer: Self-pay | Admitting: Infectious Disease

## 2024-05-18 VITALS — BP 134/84 | HR 101 | Temp 98.5°F | Wt 295.0 lb

## 2024-05-18 DIAGNOSIS — I10 Essential (primary) hypertension: Secondary | ICD-10-CM

## 2024-05-18 DIAGNOSIS — E785 Hyperlipidemia, unspecified: Secondary | ICD-10-CM

## 2024-05-18 DIAGNOSIS — B20 Human immunodeficiency virus [HIV] disease: Secondary | ICD-10-CM

## 2024-05-18 DIAGNOSIS — Z7185 Encounter for immunization safety counseling: Secondary | ICD-10-CM | POA: Diagnosis not present

## 2024-05-18 MED ORDER — ROSUVASTATIN CALCIUM 10 MG PO TABS: 10.0000 mg | ORAL_TABLET | Freq: Every day | ORAL | 4 refills | Status: DC

## 2024-05-18 MED ORDER — BIKTARVY 50-200-25 MG PO TABS: 1.0000 | ORAL_TABLET | Freq: Every day | ORAL | 11 refills | Status: DC

## 2024-07-27 ENCOUNTER — Ambulatory Visit: Admitting: Medical

## 2024-07-27 ENCOUNTER — Encounter: Payer: Self-pay | Admitting: Medical

## 2024-07-27 ENCOUNTER — Ambulatory Visit: Payer: Self-pay | Admitting: Medical

## 2024-07-27 VITALS — BP 140/90 | HR 114 | Temp 98.5°F | Resp 15 | Ht 67.0 in | Wt 293.4 lb

## 2024-07-27 DIAGNOSIS — E785 Hyperlipidemia, unspecified: Secondary | ICD-10-CM

## 2024-07-27 DIAGNOSIS — R5383 Other fatigue: Secondary | ICD-10-CM | POA: Diagnosis not present

## 2024-07-27 DIAGNOSIS — I10 Essential (primary) hypertension: Secondary | ICD-10-CM

## 2024-07-27 DIAGNOSIS — B20 Human immunodeficiency virus [HIV] disease: Secondary | ICD-10-CM

## 2024-07-27 DIAGNOSIS — R739 Hyperglycemia, unspecified: Secondary | ICD-10-CM | POA: Diagnosis not present

## 2024-07-27 DIAGNOSIS — R6882 Decreased libido: Secondary | ICD-10-CM

## 2024-07-27 DIAGNOSIS — Z566 Other physical and mental strain related to work: Secondary | ICD-10-CM

## 2024-07-27 DIAGNOSIS — N529 Male erectile dysfunction, unspecified: Secondary | ICD-10-CM

## 2024-07-27 DIAGNOSIS — Z6841 Body Mass Index (BMI) 40.0 and over, adult: Secondary | ICD-10-CM | POA: Diagnosis not present

## 2024-07-27 LAB — T4, FREE: Free T4: 0.62 ng/dL (ref 0.60–1.60)

## 2024-07-27 LAB — TSH: TSH: 2.07 u[IU]/mL (ref 0.35–5.50)

## 2024-07-27 LAB — HEMOGLOBIN A1C: Hgb A1c MFr Bld: 5.3 % (ref 4.6–6.5)

## 2024-07-27 MED ORDER — LOSARTAN POTASSIUM 25 MG PO TABS: 25.0000 mg | ORAL_TABLET | Freq: Every day | ORAL | 3 refills | Status: AC

## 2024-07-27 NOTE — Patient Instructions (Signed)
 Human immunodeficiency virus (HIV) disease, stable on antiretroviral therapy HIV well-controlled with undetectable viral load and healthy CD4 counts. On Biktarvy . - Continue Biktarvy . - Follow up with infectious disease specialist as scheduled.  Essential hypertension Elevated blood pressure readings. Anxiety and stress may contribute. Discussed losartan  for kidney protection and blood pressure control. - Prescribed losartan  25 mg daily. - Advised to obtain new, appropriately sized blood pressure cuff. - Scheduled follow-up in 2-4 weeks to reassess blood pressure.  Hyperlipidemia Previously elevated cholesterol levels. Currently on Crestor . Awaiting updated cholesterol levels from upcoming labs. - Scan and upload recent cholesterol levels to MyChart. - Await upcoming cholesterol labs scheduled for December 3rd.  Obesity Family history of obesity. Discussed weight management challenges and genetic predisposition. - Encouraged use of Weight Watchers app. - Scheduled appointment with nutritionist at the beginning of the year.  Hyperglycemia, evaluation for diabetes Elevated blood sugar levels. Family history of diabetes. No prior A1c testing reported. - Ordered A1c test.   Fatigue mild - Ordered TSH and T4 to assess thyroid function.  Decreased libido and male erectile dysfunction Reports of low libido and occasional erectile dysfunction. Discussed hormonal influences and testosterone testing. - Ordered testosterone panel.  Work-related stress and anxiety Increased anxiety related to work and family responsibilities. Stress relief through exercise and social support. - Encouraged continued exercise for stress relief.   Follow up 3 weeks or sooner if needed

## 2024-07-27 NOTE — Progress Notes (Signed)
 Subjective:    Patient ID: Jesse Frank, male    DOB: July 29, 1981, 43 y.o.   MRN: [id number]  HPI  Discussed the use of AI scribe software for clinical note transcription with the patient, who gave verbal consent to proceed.  History of Present Illness   Jesse Frank is a 43 year old male who presents for a routine follow-up.  He has high cholesterol and started Crestor  a couple of months ago. His last lipids on 05/04/2024 showed total cholesterol 262, triglycerides 271, and LDL 175.  He has HIV with an undetectable viral load and good CD4 counts. His antiretroviral therapy was recently changed to Biktarvy  and he feels his condition is controlled.  He has repeatedly elevated blood pressure at home and at work screenings, with recent readings of 148/88, 174/106, 164/85, and 164/100. He notes higher readings in medical settings and relates this to anxiety from work and family stress. No cardiac or neurologic signs/syptoms  He has anxiety related to work and caring for his father with significant heart disease and stage 4 kidney failure. He finds the anxiety noticeable but able to function.  He does not smoke. He drinks alcohol only occasionally, last use 4 months ago, and has not used marijuana for over 2 years.  He exercises at the gym at least three times weekly with 45 minutes of elliptical and weight lifting. He is concerned about his heart health and weight given a tendency toward higher weight in his family.  He reports low libido and intermittent erectile dysfunction. He has mild fatigue  and sometimes feels tired after meetings.             Review of Systems  Constitutional:  Negative for chills, fatigue and fever.  Respiratory:  Negative for chest tightness, shortness of breath and wheezing.   Cardiovascular:  Negative for chest pain and palpitations.  Gastrointestinal:  Negative for abdominal pain.  Genitourinary:  Negative for dysuria and frequency.   Musculoskeletal:  Negative for back pain.  Skin:  Negative for rash.  Neurological:  Negative for dizziness, weakness and numbness.  Hematological:  Negative for adenopathy.  Psychiatric/Behavioral:  Negative for behavioral problems, decreased concentration and hallucinations. The patient is not nervous/anxious.        Stress. Possible mild anxiety    Past Medical History:  Diagnosis Date   HIV infection (HCC)      Social History   Socioeconomic History   Marital status: Single    Spouse name: Not on file   Number of children: Not on file   Years of education: Not on file   Highest education level: Master's degree (e.g., MA, MS, MEng, MEd, MSW, MBA)  Occupational History   Not on file  Tobacco Use   Smoking status: Never   Smokeless tobacco: Never  Substance and Sexual Activity   Alcohol use: Yes    Comment: socially, seldom   Drug use: Yes    Types: Marijuana    Comment: socially, very seldom   Sexual activity: Not Currently    Partners: Female, Male    Birth control/protection: Condom    Comment: pt. accepted condoms  Other Topics Concern   Not on file  Social History Narrative   Not on file   Social Drivers of Health   Financial Resource Strain: Low Risk  (07/21/2024)   Overall Financial Resource Strain (CARDIA)    Difficulty of Paying Living Expenses: Not hard at all  Food Insecurity: No Food Insecurity (07/21/2024)  Hunger Vital Sign    Worried About Running Out of Food in the Last Year: Never true    Ran Out of Food in the Last Year: Never true  Transportation Needs: No Transportation Needs (07/21/2024)   PRAPARE - Administrator, Civil Service (Medical): No    Lack of Transportation (Non-Medical): No  Physical Activity: Sufficiently Active (07/21/2024)   Exercise Vital Sign    Days of Exercise per Week: 3 days    Minutes of Exercise per Session: 90 min  Stress: No Stress Concern Present (07/21/2024)   [employer] of Occupational  Health - Occupational Stress Questionnaire    Feeling of Stress: Only a little  Social Connections: Moderately Integrated (07/21/2024)   Social Connection and Isolation Panel    Frequency of Communication with Friends and Family: More than three times a week    Frequency of Social Gatherings with Friends and Family: More than three times a week    Attends Religious Services: More than 4 times per year    Active Member of [employer] or Organizations: Yes    Attends Engineer, Structural: More than 4 times per year    Marital Status: Never married  Intimate Partner Violence: Not on file    No past surgical history on file.  Family History  Problem Relation Age of Onset   Diabetes Father    Kidney failure Father    Heart Problems Father     No Known Allergies  Current Outpatient Medications on File Prior to Visit  Medication Sig Dispense Refill   bictegravir-emtricitabine-tenofovir  AF (BIKTARVY ) 50-200-25 MG TABS tablet Take 1 tablet by mouth daily. 30 tablet 11   rosuvastatin  (CRESTOR ) 10 MG tablet Take 1 tablet (10 mg total) by mouth daily. 30 tablet 4   No current facility-administered medications on file prior to visit.    BP (!) 140/90   Pulse (!) 114   Temp 98.5 F (36.9 C) (Oral)   Resp 15   Ht 5' 7 (1.702 m)   Wt 293 lb 6.4 oz (133.1 kg)   SpO2 97%   BMI 45.95 kg/m              Objective:   Physical Exam   General Mental Status- Alert. General Appearance- Not in acute distress.   Skin General: Color- Normal Color. Moisture- Normal Moisture.  Neck  No JVD.  Chest and Lung Exam Auscultation: Breath Sounds:-Normal.  Cardiovascular Auscultation:Rythm- Regular. Murmurs & Other Heart Sounds:Auscultation of the heart reveals- No Murmurs.  Abdomen Inspection:-Inspeection Normal. Palpation/Percussion:Note:No mass. Palpation and Percussion of the abdomen reveal- Non Tender, Non Distended + BS, no rebound or guarding.   Neurologic Cranial  Nerve exam:- CN III-XII intact(No nystagmus), symmetric smile. Strength:- 5/5 equal and symmetric strength both upper and lower extremities.       Assessment & Plan:   Assessment and Plan    Human immunodeficiency virus (HIV) disease, stable on antiretroviral therapy HIV well-controlled with undetectable viral load and healthy CD4 counts. On Biktarvy . - Continue Biktarvy . - Follow up with infectious disease specialist as scheduled.  Essential hypertension Elevated blood pressure readings. Anxiety and stress may contribute. Discussed losartan  for kidney protection and blood pressure control. - Prescribed losartan  25 mg daily. - Advised to obtain new, appropriately sized blood pressure cuff. - Scheduled follow-up in 2-4 weeks to reassess blood pressure.  Hyperlipidemia Previously elevated cholesterol levels. Currently on Crestor . Awaiting updated cholesterol levels from upcoming labs. - Scan and upload recent cholesterol levels  to MyChart. - Await upcoming cholesterol labs scheduled for December 3rd.  Obesity Family history of obesity. Discussed weight management challenges and genetic predisposition. - Encouraged use of Weight Watchers app. - Scheduled appointment with nutritionist at the beginning of the year.  Hyperglycemia, evaluation for diabetes Elevated blood sugar levels. Family history of diabetes. No prior A1c testing reported. - Ordered A1c test.   Fatigue mild - Ordered TSH and T4 to assess thyroid function.  Decreased libido and male erectile dysfunction Reports of low libido and occasional erectile dysfunction. Discussed hormonal influences and testosterone testing. - Ordered testosterone panel.  Work-related stress and anxiety Increased anxiety related to work and family responsibilities. Stress relief through exercise and social support. - Encouraged continued exercise for stress relief.   Follow up 3 weeks or sooner if needed

## 2024-08-04 ENCOUNTER — Other Ambulatory Visit

## 2024-08-04 ENCOUNTER — Other Ambulatory Visit: Payer: Self-pay

## 2024-08-04 DIAGNOSIS — B20 Human immunodeficiency virus [HIV] disease: Secondary | ICD-10-CM

## 2024-08-04 DIAGNOSIS — E785 Hyperlipidemia, unspecified: Secondary | ICD-10-CM

## 2024-08-04 DIAGNOSIS — I10 Essential (primary) hypertension: Secondary | ICD-10-CM

## 2024-08-05 LAB — T-HELPER CELLS (CD4) COUNT (NOT AT ARMC)
CD4 % Helper T Cell: 34 % (ref 33–65)
CD4 T Cell Abs: 721 /uL (ref 400–1790)

## 2024-08-06 LAB — COMPLETE METABOLIC PANEL WITHOUT GFR
AG Ratio: 1.7 (calc) (ref 1.0–2.5)
ALT: 51 U/L — ABNORMAL HIGH (ref 9–46)
AST: 26 U/L (ref 10–40)
Albumin: 4.6 g/dL (ref 3.6–5.1)
Alkaline phosphatase (APISO): 55 U/L (ref 36–130)
BUN/Creatinine Ratio: 8 (calc) (ref 6–22)
BUN: 11 mg/dL (ref 7–25)
CO2: 26 mmol/L (ref 20–32)
Calcium: 9.7 mg/dL (ref 8.6–10.3)
Chloride: 105 mmol/L (ref 98–110)
Creat: 1.41 mg/dL — ABNORMAL HIGH (ref 0.60–1.29)
Globulin: 2.7 g/dL (ref 1.9–3.7)
Glucose, Bld: 104 mg/dL — ABNORMAL HIGH (ref 65–99)
Potassium: 3.7 mmol/L (ref 3.5–5.3)
Sodium: 141 mmol/L (ref 135–146)
Total Bilirubin: 0.9 mg/dL (ref 0.2–1.2)
Total Protein: 7.3 g/dL (ref 6.1–8.1)

## 2024-08-06 LAB — CBC WITH DIFFERENTIAL/PLATELET
Absolute Lymphocytes: 2542 {cells}/uL (ref 850–3900)
Absolute Monocytes: 515 {cells}/uL (ref 200–950)
Basophils Absolute: 62 {cells}/uL (ref 0–200)
Basophils Relative: 1 %
Eosinophils Absolute: 143 {cells}/uL (ref 15–500)
Eosinophils Relative: 2.3 %
HCT: 46.9 % (ref 39.4–51.1)
Hemoglobin: 15.4 g/dL (ref 13.2–17.1)
MCH: 29.4 pg (ref 27.0–33.0)
MCHC: 32.8 g/dL (ref 31.6–35.4)
MCV: 89.7 fL (ref 81.4–101.7)
MPV: 10.1 fL (ref 7.5–12.5)
Monocytes Relative: 8.3 %
Neutro Abs: 2939 {cells}/uL (ref 1500–7800)
Neutrophils Relative %: 47.4 %
Platelets: 286 Thousand/uL (ref 140–400)
RBC: 5.23 Million/uL (ref 4.20–5.80)
RDW: 14 % (ref 11.0–15.0)
Total Lymphocyte: 41 %
WBC: 6.2 Thousand/uL (ref 3.8–10.8)

## 2024-08-06 LAB — LIPID PANEL
Cholesterol: 186 mg/dL (ref <200)
HDL: 39 mg/dL — ABNORMAL LOW (ref >=40)
LDL Cholesterol (Calc): 123 mg/dL — ABNORMAL HIGH
Non-HDL Cholesterol (Calc): 147 mg/dL — ABNORMAL HIGH (ref <130)
Total CHOL/HDL Ratio: 4.8 (calc) (ref <5.0)
Triglycerides: 126 mg/dL (ref <150)

## 2024-08-06 LAB — HIV-1 RNA QUANT-NO REFLEX-BLD
HIV 1 RNA Quant: <20 {copies}/mL — AB
HIV-1 RNA Quant, Log: <1.3 {Log_copies}/mL — AB

## 2024-08-06 LAB — SYPHILIS: RPR W/REFLEX TO RPR TITER AND TREPONEMAL ANTIBODIES, TRADITIONAL SCREENING AND DIAGNOSIS ALGORITHM: RPR Ser Ql: NONREACTIVE

## 2024-08-11 ENCOUNTER — Encounter: Payer: Self-pay | Admitting: Infectious Disease

## 2024-08-11 NOTE — Progress Notes (Signed)
 Subjective:  Chief complaint: follow-up for HIV disease on medications   Patient ID: Jesse Frank, male    DOB: [birth year], 43 y.o.   MRN: [id number]  HPI  Discussed the use of AI scribe software for clinical note transcription with the patient, who gave verbal consent to proceed.  History of Present Illness   Jesse Frank is a 43 year old male with HIV who presents for routine follow-up.  He is currently on Biktarvy  for HIV management. His recent labs showed a viral load of less than 20 and a CD4 count of 721. He switched to Biktarvy  from Genvoya .  He has a history of hypertension and is currently taking losartan . He was started on irbesartan 25 mg a couple of weeks ago by his primary physician, which he has not yet refilled.  He is on Crestor  for cholesterol management. He has reviewed his recent lab results and is concerned about his creatinine level, which is at 1.4.  He has a follow-up appointment with his primary care physician scheduled for August 16, 2024.      Past Medical History:  Diagnosis Date   HIV infection (HCC)     No past surgical history on file.  Family History  Problem Relation Age of Onset   Hyperlipidemia Father    Heart disease Father    Kidney failure Father    Heart Problems Father       Social History   Socioeconomic History   Marital status: Single    Spouse name: Not on file   Number of children: Not on file   Years of education: Not on file   Highest education level: Master's degree (e.g., MA, MS, MEng, MEd, MSW, MBA)  Occupational History   Not on file  Tobacco Use   Smoking status: Never   Smokeless tobacco: Never  Substance and Sexual Activity   Alcohol use: Yes    Comment: socially, seldom   Drug use: Yes    Types: Marijuana    Comment: socially, very seldom   Sexual activity: Not Currently    Partners: Female, Male    Birth control/protection: Condom    Comment: pt. accepted condoms  Other Topics Concern   Not  on file  Social History Narrative   Not on file   Social Drivers of Health   Financial Resource Strain: Low Risk  (07/21/2024)   Overall Financial Resource Strain (CARDIA)    Difficulty of Paying Living Expenses: Not hard at all  Food Insecurity: No Food Insecurity (07/21/2024)   Hunger Vital Sign    Worried About Running Out of Food in the Last Year: Never true    Ran Out of Food in the Last Year: Never true  Transportation Needs: No Transportation Needs (07/21/2024)   PRAPARE - Administrator, Civil Service (Medical): No    Lack of Transportation (Non-Medical): No  Physical Activity: Sufficiently Active (07/21/2024)   Exercise Vital Sign    Days of Exercise per Week: 3 days    Minutes of Exercise per Session: 90 min  Stress: No Stress Concern Present (07/21/2024)   [employer] of Occupational Health - Occupational Stress Questionnaire    Feeling of Stress: Only a little  Social Connections: Moderately Integrated (07/21/2024)   Social Connection and Isolation Panel    Frequency of Communication with Friends and Family: More than three times a week    Frequency of Social Gatherings with Friends and Family: More than three times a week  Attends Religious Services: More than 4 times per year    Active Member of Clubs or Organizations: Yes    Attends Banker Meetings: More than 4 times per year    Marital Status: Never married    No Known Allergies   Current Outpatient Medications:    bictegravir-emtricitabine-tenofovir  AF (BIKTARVY ) 50-200-25 MG TABS tablet, Take 1 tablet by mouth daily., Disp: 30 tablet, Rfl: 11   losartan  (COZAAR ) 25 MG tablet, Take 1 tablet (25 mg total) by mouth daily., Disp: 90 tablet, Rfl: 3   rosuvastatin  (CRESTOR ) 10 MG tablet, Take 1 tablet (10 mg total) by mouth daily., Disp: 30 tablet, Rfl: 4   Review of Systems  Constitutional:  Negative for activity change, appetite change, chills, diaphoresis, fatigue, fever and  unexpected weight change.  HENT:  Negative for congestion, rhinorrhea, sinus pressure, sneezing, sore throat and trouble swallowing.   Eyes:  Negative for photophobia and visual disturbance.  Respiratory:  Negative for cough, chest tightness, shortness of breath, wheezing and stridor.   Cardiovascular:  Negative for chest pain, palpitations and leg swelling.  Gastrointestinal:  Negative for abdominal distention, abdominal pain, anal bleeding, blood in stool, constipation, diarrhea, nausea and vomiting.  Genitourinary:  Negative for difficulty urinating, dysuria, flank pain and hematuria.  Musculoskeletal:  Negative for arthralgias, back pain, gait problem, joint swelling and myalgias.  Skin:  Negative for color change, pallor, rash and wound.  Neurological:  Negative for dizziness, tremors, weakness and light-headedness.  Hematological:  Negative for adenopathy. Does not bruise/bleed easily.  Psychiatric/Behavioral:  Negative for agitation, behavioral problems, confusion, decreased concentration, dysphoric mood and sleep disturbance.        Objective:   Physical Exam Constitutional:      Appearance: He is well-developed.  HENT:     Head: Normocephalic and atraumatic.  Eyes:     Conjunctiva/sclera: Conjunctivae normal.  Cardiovascular:     Rate and Rhythm: Normal rate and regular rhythm.  Pulmonary:     Effort: Pulmonary effort is normal. No respiratory distress.     Breath sounds: No wheezing.  Abdominal:     General: There is no distension.     Palpations: Abdomen is soft.  Musculoskeletal:        General: No tenderness. Normal range of motion.     Cervical back: Normal range of motion and neck supple.  Skin:    General: Skin is warm and dry.     Coloration: Skin is not pale.     Findings: No erythema or rash.  Neurological:     General: No focal deficit present.     Mental Status: He is alert and oriented to person, place, and time.  Psychiatric:        Mood and Affect:  Mood normal.        Behavior: Behavior normal.        Thought Content: Thought content normal.        Judgment: Judgment normal.           Assessment & Plan:   Assessment and Plan    Human immunodeficiency virus (HIV) disease HIV is well-controlled with viral load <20 and CD4 count at 721. Recent creatinine level 1.4, possibly influenced by irbesartan. Genvoya  and Biktarvy  both have INSTI's that can inhibit distal creatinine secretion, but Genvoya  also has COBI which can do the same so I would not think reducing the number of inhibitors of secretion would cause an increase in serum creatine  - Continue Biktarvy   HTN with some renal insufficiency:  - Follow up with primary care provider, long term his BP being up is more likely to be a driver of  renal disease though he coudl have a bit of prerenal issues with ARB   Hyperlipidemia: continue crestor    Vaccine counseling: he had flu and COVID vaccines via work.

## 2024-08-12 ENCOUNTER — Ambulatory Visit: Payer: Self-pay | Admitting: Infectious Disease

## 2024-08-12 ENCOUNTER — Encounter: Payer: Self-pay | Admitting: Infectious Disease

## 2024-08-12 ENCOUNTER — Other Ambulatory Visit: Payer: Self-pay

## 2024-08-12 VITALS — BP 153/88 | HR 90 | Temp 97.2°F | Ht 67.0 in | Wt 286.0 lb

## 2024-08-12 DIAGNOSIS — B20 Human immunodeficiency virus [HIV] disease: Secondary | ICD-10-CM | POA: Diagnosis not present

## 2024-08-12 DIAGNOSIS — E785 Hyperlipidemia, unspecified: Secondary | ICD-10-CM | POA: Diagnosis not present

## 2024-08-12 DIAGNOSIS — Z113 Encounter for screening for infections with a predominantly sexual mode of transmission: Secondary | ICD-10-CM | POA: Diagnosis not present

## 2024-08-12 DIAGNOSIS — Z7185 Encounter for immunization safety counseling: Secondary | ICD-10-CM | POA: Diagnosis not present

## 2024-08-12 DIAGNOSIS — I10 Essential (primary) hypertension: Secondary | ICD-10-CM

## 2024-08-12 DIAGNOSIS — N289 Disorder of kidney and ureter, unspecified: Secondary | ICD-10-CM | POA: Diagnosis not present

## 2024-08-16 ENCOUNTER — Ambulatory Visit: Admitting: Medical

## 2024-08-16 ENCOUNTER — Encounter: Payer: Self-pay | Admitting: Medical

## 2024-08-16 VITALS — BP 128/80 | HR 78 | Temp 98.4°F | Resp 15 | Ht 67.0 in | Wt 290.0 lb

## 2024-08-16 DIAGNOSIS — R5383 Other fatigue: Secondary | ICD-10-CM

## 2024-08-16 DIAGNOSIS — E66813 Obesity, class 3: Secondary | ICD-10-CM

## 2024-08-16 DIAGNOSIS — E785 Hyperlipidemia, unspecified: Secondary | ICD-10-CM

## 2024-08-16 DIAGNOSIS — I10 Essential (primary) hypertension: Secondary | ICD-10-CM

## 2024-08-16 DIAGNOSIS — R6882 Decreased libido: Secondary | ICD-10-CM

## 2024-08-16 DIAGNOSIS — R739 Hyperglycemia, unspecified: Secondary | ICD-10-CM

## 2024-08-16 LAB — COMPREHENSIVE METABOLIC PANEL WITH GFR
ALT: 31 U/L (ref 0–53)
AST: 22 U/L (ref 0–37)
Albumin: 4.5 g/dL (ref 3.5–5.2)
Alkaline Phosphatase: 58 U/L (ref 39–117)
BUN: 10 mg/dL (ref 6–23)
CO2: 32 meq/L (ref 19–32)
Calcium: 10.1 mg/dL (ref 8.4–10.5)
Chloride: 103 meq/L (ref 96–112)
Creatinine, Ser: 1.48 mg/dL (ref 0.40–1.50)
GFR: 57.72 mL/min — ABNORMAL LOW (ref >60.00)
Glucose, Bld: 105 mg/dL — ABNORMAL HIGH (ref 70–99)
Potassium: 4 meq/L (ref 3.5–5.1)
Sodium: 141 meq/L (ref 135–145)
Total Bilirubin: 0.6 mg/dL (ref 0.2–1.2)
Total Protein: 7.5 g/dL (ref 6.0–8.3)

## 2024-08-16 LAB — VITAMIN B12: Vitamin B-12: 648 pg/mL (ref 211–911)

## 2024-08-16 NOTE — Patient Instructions (Signed)
 Hypertension Blood pressure controlled at 128/86 mmHg on losartan . Discussed sleep apnea's impact on blood pressure and fatigue. - Continue losartan  25 mg daily. - Monitor blood pressure at home. - Consider referral for sleep study if snoring is confirmed.  Hyperlipidemia On  crestor  10 mg daily. Discussed low sugar diet and exercise. Insurance covers nutritionist services. - Continue crestor  10 mg. - Maintain low sugar diet and exercise. - Referred to nutritionist for cholesterol management and weight loss advice.  Obesity, class 3 Discussed weight's impact on fatigue and blood pressure. Considered sleep apnea as a factor. - Referred to nutritionist for weight loss advice.  Fatigue and decreased libido Persistent fatigue and decreased libido. Thyroid  normal. Discussed B12 deficiency and testosterone  levels. Considered sleep apnea as a factor. - Ordered B12 level. - Ordered testosterone  panel. -cmp with gfr to assess kidney function due to your concern - Consider referral for sleep study if snoring is confirmed. -pt asked significant other and states some mild snoring.   Follow up date to be determined after lab review. potentially in 3-6 months.

## 2024-08-16 NOTE — Progress Notes (Signed)
° °  Subjective:    Patient ID: Jesse Frank, male    DOB: [birth year], 43 y.o.   MRN: [id number]  HPI  Discussed the use of AI scribe software for clinical note transcription with the patient, who gave verbal consent to proceed.  History of Present Illness   Jesse Frank is a 43 year old male with hypertension and hyperlipidemia who presents with concerns of fatigue and decreased libido.  He reports fatigue and decreased libido that he associates with increased stress after a recent job role change. Significant other states he snores mildly.  His blood pressure today is 128/86 mmHg, lower than prior office readings. He takes losartan  25 mg daily and has not checked his blood pressure at home recently, though he states home readings are usually lower than in clinic.  Average blood sugars in the non-diabetic range and follows a low sugar diet with regular exercise. Prior thyroid  studies including TSH and T4 were normal.  He takes rosuvastatin  10 mg for hyperlipidemia. He is concerned about kidney health due to a family history of kidney issues. Recent creatinine was 1.23 within normal range.          Review of Systems  See  hpi       Objective:   Physical Exam  General- No acute distress. Pleasant patient. Neck- Full range of motion, no jvd Lungs- Clear, even and unlabored. Heart- regular rate and rhythm. Neurologic- CNII- XII grossly intact.       Assessment & Plan:   Assessment and Plan    Hypertension Blood pressure controlled at 128/86 mmHg on losartan . Discussed sleep apnea's impact on blood pressure and fatigue. - Continue losartan  25 mg daily. - Monitor blood pressure at home. - Consider referral for sleep study if snoring is confirmed.  Hyperlipidemia On  crestor  10 mg daily. Discussed low sugar diet and exercise. Insurance covers nutritionist services. - Continue crestor  10 mg. - Maintain low sugar diet and exercise. - Referred to nutritionist for  cholesterol management and weight loss advice.  Obesity, class 3 Discussed weight's impact on fatigue and blood pressure. Considered sleep apnea as a factor. - Referred to nutritionist for weight loss advice.  Fatigue and decreased libido Persistent fatigue and decreased libido. Thyroid  normal. Discussed B12 deficiency and testosterone  levels. Considered sleep apnea as a factor. - Ordered B12 level. - Ordered testosterone  panel. -cmp with gfr to assess kidney function due to your concern - Consider referral for sleep study if snoring is confirmed. -pt asked significant other and states some mild snoring.   Follow up date to be determined after lab review. potentially in 3-6 months.        Jesse Sandall, PA-C

## 2024-08-17 ENCOUNTER — Ambulatory Visit: Payer: Self-pay | Admitting: Medical

## 2024-08-17 LAB — TESTOSTERONE TOTAL,FREE,BIO, MALES
Albumin: 4.4 g/dL (ref 3.6–5.1)
Sex Hormone Binding: 23 nmol/L (ref 10–50)
Testosterone, Bioavailable: 158.1 ng/dL (ref 110.0–575.0)
Testosterone, Free: 78.5 pg/mL (ref 46.0–224.0)
Testosterone: 442 ng/dL (ref 250–827)

## 2024-08-21 ENCOUNTER — Other Ambulatory Visit: Payer: Self-pay | Admitting: Infectious Disease

## 2024-08-21 DIAGNOSIS — B20 Human immunodeficiency virus [HIV] disease: Secondary | ICD-10-CM

## 2024-08-21 DIAGNOSIS — E785 Hyperlipidemia, unspecified: Secondary | ICD-10-CM

## 2024-11-15 ENCOUNTER — Ambulatory Visit: Admitting: Medical

## 2025-02-02 ENCOUNTER — Other Ambulatory Visit: Payer: Self-pay

## 2025-02-16 ENCOUNTER — Ambulatory Visit: Payer: Self-pay | Admitting: Infectious Disease
